# Patient Record
Sex: Male | Born: 1990
Health system: Southern US, Community
[De-identification: ages and names within clinical notes are randomized; demographics above are authoritative.]

---

## 2020-07-04 ENCOUNTER — Encounter (HOSPITAL_COMMUNITY): Payer: Self-pay | Admitting: Internal Medicine

## 2020-07-04 ENCOUNTER — Inpatient Hospital Stay (HOSPITAL_COMMUNITY): Payer: BC Managed Care – PPO

## 2020-07-04 ENCOUNTER — Inpatient Hospital Stay (HOSPITAL_COMMUNITY)
Admission: EM | Admit: 2020-07-04 | Discharge: 2020-07-12 | DRG: 871 | Disposition: A | Discharge status: Home / Self Care | Payer: BC Managed Care – PPO | Attending: Internal Medicine | Admitting: Internal Medicine

## 2020-07-04 DIAGNOSIS — Z20822 Contact with and (suspected) exposure to covid-19: Secondary | ICD-10-CM | POA: Diagnosis present

## 2020-07-04 DIAGNOSIS — R7989 Other specified abnormal findings of blood chemistry: Secondary | ICD-10-CM | POA: Diagnosis not present

## 2020-07-04 DIAGNOSIS — L539 Erythematous condition, unspecified: Secondary | ICD-10-CM

## 2020-07-04 DIAGNOSIS — M6282 Rhabdomyolysis: Secondary | ICD-10-CM | POA: Diagnosis present

## 2020-07-04 DIAGNOSIS — B9561 Methicillin susceptible Staphylococcus aureus infection as the cause of diseases classified elsewhere: Secondary | ICD-10-CM | POA: Diagnosis not present

## 2020-07-04 DIAGNOSIS — F1113 Opioid abuse with withdrawal: Secondary | ICD-10-CM | POA: Diagnosis present

## 2020-07-04 DIAGNOSIS — Z781 Physical restraint status: Secondary | ICD-10-CM | POA: Diagnosis not present

## 2020-07-04 DIAGNOSIS — R410 Disorientation, unspecified: Secondary | ICD-10-CM | POA: Diagnosis present

## 2020-07-04 DIAGNOSIS — G9341 Metabolic encephalopathy: Secondary | ICD-10-CM | POA: Diagnosis present

## 2020-07-04 DIAGNOSIS — B9689 Other specified bacterial agents as the cause of diseases classified elsewhere: Secondary | ICD-10-CM | POA: Diagnosis not present

## 2020-07-04 DIAGNOSIS — A4101 Sepsis due to Methicillin susceptible Staphylococcus aureus: Secondary | ICD-10-CM | POA: Diagnosis present

## 2020-07-04 DIAGNOSIS — G9340 Encephalopathy, unspecified: Secondary | ICD-10-CM | POA: Diagnosis not present

## 2020-07-04 DIAGNOSIS — F10231 Alcohol dependence with withdrawal delirium: Secondary | ICD-10-CM | POA: Diagnosis present

## 2020-07-04 DIAGNOSIS — N179 Acute kidney failure, unspecified: Secondary | ICD-10-CM

## 2020-07-04 DIAGNOSIS — E871 Hypo-osmolality and hyponatremia: Secondary | ICD-10-CM | POA: Diagnosis present

## 2020-07-04 DIAGNOSIS — D649 Anemia, unspecified: Secondary | ICD-10-CM | POA: Diagnosis present

## 2020-07-04 DIAGNOSIS — D696 Thrombocytopenia, unspecified: Secondary | ICD-10-CM | POA: Diagnosis present

## 2020-07-04 DIAGNOSIS — F17200 Nicotine dependence, unspecified, uncomplicated: Secondary | ICD-10-CM | POA: Diagnosis present

## 2020-07-04 DIAGNOSIS — N170 Acute kidney failure with tubular necrosis: Secondary | ICD-10-CM | POA: Diagnosis present

## 2020-07-04 DIAGNOSIS — B1920 Unspecified viral hepatitis C without hepatic coma: Secondary | ICD-10-CM | POA: Diagnosis present

## 2020-07-04 DIAGNOSIS — I82611 Acute embolism and thrombosis of superficial veins of right upper extremity: Secondary | ICD-10-CM | POA: Diagnosis present

## 2020-07-04 DIAGNOSIS — R7401 Elevation of levels of liver transaminase levels: Secondary | ICD-10-CM | POA: Diagnosis not present

## 2020-07-04 DIAGNOSIS — E875 Hyperkalemia: Secondary | ICD-10-CM | POA: Diagnosis present

## 2020-07-04 DIAGNOSIS — R768 Other specified abnormal immunological findings in serum: Secondary | ICD-10-CM

## 2020-07-04 DIAGNOSIS — M7989 Other specified soft tissue disorders: Secondary | ICD-10-CM | POA: Diagnosis not present

## 2020-07-04 DIAGNOSIS — R7402 Elevation of levels of lactic acid dehydrogenase (LDH): Secondary | ICD-10-CM | POA: Diagnosis present

## 2020-07-04 DIAGNOSIS — D72829 Elevated white blood cell count, unspecified: Secondary | ICD-10-CM

## 2020-07-04 DIAGNOSIS — F329 Major depressive disorder, single episode, unspecified: Secondary | ICD-10-CM | POA: Diagnosis present

## 2020-07-04 DIAGNOSIS — M25571 Pain in right ankle and joints of right foot: Secondary | ICD-10-CM | POA: Diagnosis present

## 2020-07-04 DIAGNOSIS — E874 Mixed disorder of acid-base balance: Secondary | ICD-10-CM | POA: Diagnosis present

## 2020-07-04 DIAGNOSIS — R7881 Bacteremia: Secondary | ICD-10-CM | POA: Diagnosis not present

## 2020-07-04 DIAGNOSIS — E861 Hypovolemia: Secondary | ICD-10-CM | POA: Diagnosis present

## 2020-07-04 DIAGNOSIS — F1410 Cocaine abuse, uncomplicated: Secondary | ICD-10-CM | POA: Diagnosis present

## 2020-07-04 DIAGNOSIS — F1910 Other psychoactive substance abuse, uncomplicated: Secondary | ICD-10-CM

## 2020-07-04 DIAGNOSIS — R7981 Abnormal blood-gas level: Secondary | ICD-10-CM | POA: Diagnosis not present

## 2020-07-04 LAB — BLOOD CULTURE ID PANEL (REFLEXED) - BCID2
A.calcoaceticus-baumannii: NOT DETECTED
Bacteroides fragilis: NOT DETECTED
Candida albicans: NOT DETECTED
Candida auris: NOT DETECTED
Candida glabrata: NOT DETECTED
Candida krusei: NOT DETECTED
Candida parapsilosis: NOT DETECTED
Candida tropicalis: NOT DETECTED
Cryptococcus neoformans/gattii: NOT DETECTED
Enterobacter cloacae complex: NOT DETECTED
Enterobacterales: NOT DETECTED
Enterococcus Faecium: NOT DETECTED
Enterococcus faecalis: NOT DETECTED
Escherichia coli: NOT DETECTED
Haemophilus influenzae: NOT DETECTED
Klebsiella aerogenes: NOT DETECTED
Klebsiella oxytoca: NOT DETECTED
Klebsiella pneumoniae: NOT DETECTED
Listeria monocytogenes: NOT DETECTED
Meth resistant mecA/C and MREJ: NOT DETECTED
Methicillin resistance mecA/C: NOT DETECTED
Neisseria meningitidis: NOT DETECTED
Proteus species: NOT DETECTED
Pseudomonas aeruginosa: NOT DETECTED
Salmonella species: NOT DETECTED
Serratia marcescens: NOT DETECTED
Staphylococcus aureus (BCID): DETECTED — AB
Staphylococcus epidermidis: DETECTED — AB
Staphylococcus lugdunensis: NOT DETECTED
Staphylococcus species: DETECTED — AB
Stenotrophomonas maltophilia: NOT DETECTED
Streptococcus agalactiae: NOT DETECTED
Streptococcus pneumoniae: NOT DETECTED
Streptococcus pyogenes: NOT DETECTED
Streptococcus species: DETECTED — AB

## 2020-07-04 LAB — COMPREHENSIVE METABOLIC PANEL
ALT: 359 U/L — ABNORMAL HIGH (ref 0–44)
AST: 802 U/L — ABNORMAL HIGH (ref 15–41)
Albumin: 4.8 g/dL (ref 3.5–5.0)
Alkaline Phosphatase: 82 U/L (ref 38–126)
Anion gap: 25 — ABNORMAL HIGH (ref 5–15)
BUN: 61 mg/dL — ABNORMAL HIGH (ref 6–20)
CO2: 20 mmol/L — ABNORMAL LOW (ref 22–32)
Calcium: 8 mg/dL — ABNORMAL LOW (ref 8.9–10.3)
Chloride: 86 mmol/L — ABNORMAL LOW (ref 98–111)
Creatinine, Ser: 3.49 mg/dL — ABNORMAL HIGH (ref 0.61–1.24)
GFR, Estimated: 23 mL/min — ABNORMAL LOW (ref >60)
Glucose, Bld: 70 mg/dL (ref 70–99)
Potassium: 6.2 mmol/L — ABNORMAL HIGH (ref 3.5–5.1)
Sodium: 131 mmol/L — ABNORMAL LOW (ref 135–145)
Total Bilirubin: 2.5 mg/dL — ABNORMAL HIGH (ref 0.3–1.2)
Total Protein: 7.7 g/dL (ref 6.5–8.1)

## 2020-07-04 LAB — URINALYSIS, ROUTINE W REFLEX MICROSCOPIC
Bilirubin Urine: NEGATIVE
Glucose, UA: NEGATIVE mg/dL
Ketones, ur: 5 mg/dL — AB
Leukocytes,Ua: NEGATIVE
Nitrite: NEGATIVE
Protein, ur: 100 mg/dL — AB
Specific Gravity, Urine: 1.015 (ref 1.005–1.030)
pH: 5 (ref 5.0–8.0)

## 2020-07-04 LAB — CBC WITH DIFFERENTIAL/PLATELET
Abs Immature Granulocytes: 0.14 10*3/uL — ABNORMAL HIGH (ref 0.00–0.07)
Abs Immature Granulocytes: 0.27 10*3/uL — ABNORMAL HIGH (ref 0.00–0.07)
Basophils Absolute: 0.1 10*3/uL (ref 0.0–0.1)
Basophils Absolute: 0 10*3/uL (ref 0.0–0.1)
Basophils Relative: 0 %
Basophils Relative: 0 %
Eosinophils Absolute: 0 10*3/uL (ref 0.0–0.5)
Eosinophils Absolute: 0 10*3/uL (ref 0.0–0.5)
Eosinophils Relative: 0 %
Eosinophils Relative: 0 %
HCT: 40.1 % (ref 39.0–52.0)
HCT: 40.7 % (ref 39.0–52.0)
Hemoglobin: 14.3 g/dL (ref 13.0–17.0)
Hemoglobin: 14.5 g/dL (ref 13.0–17.0)
Immature Granulocytes: 1 %
Immature Granulocytes: 1 %
Lymphocytes Relative: 3 %
Lymphocytes Relative: 3 %
Lymphs Abs: 0.7 10*3/uL (ref 0.7–4.0)
Lymphs Abs: 0.7 10*3/uL (ref 0.7–4.0)
MCH: 30.7 pg (ref 26.0–34.0)
MCH: 30.9 pg (ref 26.0–34.0)
MCHC: 35.7 g/dL (ref 30.0–36.0)
MCHC: 35.6 g/dL (ref 30.0–36.0)
MCV: 86.1 fL (ref 80.0–100.0)
MCV: 86.8 fL (ref 80.0–100.0)
Monocytes Absolute: 2 10*3/uL — ABNORMAL HIGH (ref 0.1–1.0)
Monocytes Absolute: 2.8 10*3/uL — ABNORMAL HIGH (ref 0.1–1.0)
Monocytes Relative: 8 %
Monocytes Relative: 11 %
Neutro Abs: 22.5 10*3/uL — ABNORMAL HIGH (ref 1.7–7.7)
Neutro Abs: 20.7 10*3/uL — ABNORMAL HIGH (ref 1.7–7.7)
Neutrophils Relative %: 88 %
Neutrophils Relative %: 85 %
Platelets: 203 10*3/uL (ref 150–400)
Platelets: 195 10*3/uL (ref 150–400)
RBC: 4.66 MIL/uL (ref 4.22–5.81)
RBC: 4.69 MIL/uL (ref 4.22–5.81)
RDW: 12.9 % (ref 11.5–15.5)
RDW: 13 % (ref 11.5–15.5)
WBC: 25.5 10*3/uL — ABNORMAL HIGH (ref 4.0–10.5)
WBC: 24.5 10*3/uL — ABNORMAL HIGH (ref 4.0–10.5)
nRBC: 0 % (ref 0.0–0.2)
nRBC: 0 % (ref 0.0–0.2)

## 2020-07-04 LAB — RAPID URINE DRUG SCREEN, HOSP PERFORMED
Amphetamines: NOT DETECTED
Barbiturates: NOT DETECTED
Benzodiazepines: NOT DETECTED
Cocaine: POSITIVE — AB
Opiates: POSITIVE — AB
Tetrahydrocannabinol: NOT DETECTED

## 2020-07-04 LAB — BASIC METABOLIC PANEL
Anion gap: 25 — ABNORMAL HIGH (ref 5–15)
Anion gap: 19 — ABNORMAL HIGH (ref 5–15)
BUN: 64 mg/dL — ABNORMAL HIGH (ref 6–20)
BUN: 73 mg/dL — ABNORMAL HIGH (ref 6–20)
CO2: 17 mmol/L — ABNORMAL LOW (ref 22–32)
CO2: 19 mmol/L — ABNORMAL LOW (ref 22–32)
Calcium: 8 mg/dL — ABNORMAL LOW (ref 8.9–10.3)
Calcium: 7.8 mg/dL — ABNORMAL LOW (ref 8.9–10.3)
Chloride: 88 mmol/L — ABNORMAL LOW (ref 98–111)
Chloride: 95 mmol/L — ABNORMAL LOW (ref 98–111)
Creatinine, Ser: 3.43 mg/dL — ABNORMAL HIGH (ref 0.61–1.24)
Creatinine, Ser: 3.37 mg/dL — ABNORMAL HIGH (ref 0.61–1.24)
GFR, Estimated: 24 mL/min — ABNORMAL LOW (ref >60)
GFR, Estimated: 24 mL/min — ABNORMAL LOW (ref >60)
Glucose, Bld: 69 mg/dL — ABNORMAL LOW (ref 70–99)
Glucose, Bld: 90 mg/dL (ref 70–99)
Potassium: 5.1 mmol/L (ref 3.5–5.1)
Potassium: 4.8 mmol/L (ref 3.5–5.1)
Sodium: 130 mmol/L — ABNORMAL LOW (ref 135–145)
Sodium: 133 mmol/L — ABNORMAL LOW (ref 135–145)

## 2020-07-04 LAB — CBG MONITORING, ED
Glucose-Capillary: 77 mg/dL (ref 70–99)
Glucose-Capillary: 76 mg/dL (ref 70–99)
Glucose-Capillary: 93 mg/dL (ref 70–99)
Glucose-Capillary: 83 mg/dL (ref 70–99)

## 2020-07-04 LAB — VOLATILES,BLD-ACETONE,ETHANOL,ISOPROP,METHANOL
Acetone, blood: <0.01 g/dL (ref 0.000–0.010)
Ethanol, blood: <0.01 g/dL (ref 0.000–0.010)
Isopropanol, blood: <0.01 g/dL (ref 0.000–0.010)
Methanol, blood: <0.01 g/dL (ref 0.000–0.010)

## 2020-07-04 LAB — LACTIC ACID, PLASMA: Lactic Acid, Venous: 1.8 mmol/L (ref 0.5–1.9)

## 2020-07-04 LAB — HEPATIC FUNCTION PANEL
ALT: 384 U/L — ABNORMAL HIGH (ref 0–44)
ALT: 443 U/L — ABNORMAL HIGH (ref 0–44)
AST: 928 U/L — ABNORMAL HIGH (ref 15–41)
AST: 1085 U/L — ABNORMAL HIGH (ref 15–41)
Albumin: 4.7 g/dL (ref 3.5–5.0)
Albumin: 3.7 g/dL (ref 3.5–5.0)
Alkaline Phosphatase: 83 U/L (ref 38–126)
Alkaline Phosphatase: 63 U/L (ref 38–126)
Bilirubin, Direct: 0.3 mg/dL — ABNORMAL HIGH (ref 0.0–0.2)
Bilirubin, Direct: 0.2 mg/dL (ref 0.0–0.2)
Indirect Bilirubin: 2.7 mg/dL — ABNORMAL HIGH (ref 0.3–0.9)
Indirect Bilirubin: 1.4 mg/dL — ABNORMAL HIGH (ref 0.3–0.9)
Total Bilirubin: 3 mg/dL — ABNORMAL HIGH (ref 0.3–1.2)
Total Bilirubin: 1.6 mg/dL — ABNORMAL HIGH (ref 0.3–1.2)
Total Protein: 7.8 g/dL (ref 6.5–8.1)
Total Protein: 6.3 g/dL — ABNORMAL LOW (ref 6.5–8.1)

## 2020-07-04 LAB — TROPONIN I (HIGH SENSITIVITY)
Troponin I (High Sensitivity): 226 ng/L (ref <18)
Troponin I (High Sensitivity): 170 ng/L (ref <18)

## 2020-07-04 LAB — PROTIME-INR
INR: 1.3 — ABNORMAL HIGH (ref 0.8–1.2)
Prothrombin Time: 15.3 seconds — ABNORMAL HIGH (ref 11.4–15.2)

## 2020-07-04 LAB — SALICYLATE LEVEL: Salicylate Lvl: <7 mg/dL — ABNORMAL LOW (ref 7.0–30.0)

## 2020-07-04 LAB — CK: Total CK: 28714 U/L — ABNORMAL HIGH (ref 49–397)

## 2020-07-04 LAB — HEPATITIS PANEL, ACUTE
HCV Ab: REACTIVE — AB
Hep A IgM: NONREACTIVE
Hep B C IgM: NONREACTIVE
Hepatitis B Surface Ag: NONREACTIVE

## 2020-07-04 LAB — ETHYLENE GLYCOL: Ethylene Glycol Lvl: <5 mg/dL

## 2020-07-04 LAB — HIV ANTIBODY (ROUTINE TESTING W REFLEX): HIV Screen 4th Generation wRfx: NONREACTIVE

## 2020-07-04 LAB — ACETAMINOPHEN LEVEL: Acetaminophen (Tylenol), Serum: <10 ug/mL — ABNORMAL LOW (ref 10–30)

## 2020-07-04 LAB — RESP PANEL BY RT-PCR (FLU A&B, COVID) ARPGX2
Influenza A by PCR: NEGATIVE
Influenza B by PCR: NEGATIVE
SARS Coronavirus 2 by RT PCR: NEGATIVE

## 2020-07-04 LAB — MAGNESIUM: Magnesium: 2.8 mg/dL — ABNORMAL HIGH (ref 1.7–2.4)

## 2020-07-04 LAB — PHOSPHORUS: Phosphorus: 6.9 mg/dL — ABNORMAL HIGH (ref 2.5–4.6)

## 2020-07-04 LAB — ETHANOL: Alcohol, Ethyl (B): <10 mg/dL (ref <10)

## 2020-07-04 MED ORDER — CALCIUM GLUCONATE 10 % IV SOLN
1.0000 g | Freq: Once | INTRAVENOUS | Status: AC
  Administered 2020-07-04: 1 g via INTRAVENOUS
  Filled 2020-07-04: qty 10

## 2020-07-04 MED ORDER — THIAMINE HCL 100 MG/ML IJ SOLN
Freq: Once | INTRAVENOUS | Status: AC
  Filled 2020-07-04: qty 1000

## 2020-07-04 MED ORDER — THIAMINE HCL 100 MG/ML IJ SOLN
100.0000 mg | Freq: Every day | INTRAMUSCULAR | Status: DC
  Administered 2020-07-05 – 2020-07-06 (×2): 100 mg via INTRAVENOUS
  Filled 2020-07-04 (×2): qty 2

## 2020-07-04 MED ORDER — SODIUM CHLORIDE 0.9 % IV SOLN: INTRAVENOUS | Status: DC

## 2020-07-04 MED ORDER — LORAZEPAM 2 MG/ML IJ SOLN
1.0000 mg | INTRAMUSCULAR | Status: DC | PRN
  Administered 2020-07-04: 1 mg via INTRAVENOUS
  Filled 2020-07-04: qty 1

## 2020-07-04 MED ORDER — THIAMINE HCL 100 MG PO TABS
100.0000 mg | ORAL_TABLET | Freq: Every day | ORAL | Status: DC
  Administered 2020-07-07 – 2020-07-12 (×6): 100 mg via ORAL
  Filled 2020-07-04 (×6): qty 1

## 2020-07-04 MED ORDER — LORAZEPAM 2 MG/ML IJ SOLN
0.0000 mg | Freq: Two times a day (BID) | INTRAMUSCULAR | Status: DC
  Administered 2020-07-06 – 2020-07-07 (×2): 2 mg via INTRAVENOUS
  Filled 2020-07-04 (×2): qty 1

## 2020-07-04 MED ORDER — DEXTROSE 50 % IV SOLN
1.0000 | Freq: Once | INTRAVENOUS | Status: AC
  Administered 2020-07-04: 50 mL via INTRAVENOUS
  Filled 2020-07-04: qty 50

## 2020-07-04 MED ORDER — LORAZEPAM 2 MG/ML IJ SOLN
0.0000 mg | Freq: Four times a day (QID) | INTRAMUSCULAR | Status: DC
  Administered 2020-07-04 – 2020-07-05 (×2): 4 mg via INTRAVENOUS
  Administered 2020-07-05 (×2): 2 mg via INTRAVENOUS
  Administered 2020-07-06: 4 mg via INTRAVENOUS
  Administered 2020-07-06: 3 mg via INTRAVENOUS
  Filled 2020-07-04: qty 1
  Filled 2020-07-04 (×4): qty 2
  Filled 2020-07-04: qty 1
  Filled 2020-07-04 (×2): qty 2

## 2020-07-04 MED ORDER — INSULIN ASPART 100 UNIT/ML ~~LOC~~ SOLN
5.0000 [IU] | Freq: Once | SUBCUTANEOUS | Status: AC
  Administered 2020-07-04: 5 [IU] via INTRAVENOUS

## 2020-07-04 MED ORDER — SODIUM POLYSTYRENE SULFONATE 15 GM/60ML PO SUSP
15.0000 g | Freq: Once | ORAL | Status: DC
  Administered 2020-07-04: 15 g via ORAL
  Filled 2020-07-04: qty 60

## 2020-07-04 MED ORDER — SODIUM CHLORIDE 0.9 % IV BOLUS
2000.0000 mL | Freq: Once | INTRAVENOUS | Status: AC
  Administered 2020-07-04: 2000 mL via INTRAVENOUS

## 2020-07-04 MED ORDER — ZIPRASIDONE MESYLATE 20 MG IM SOLR
20.0000 mg | Freq: Once | INTRAMUSCULAR | Status: AC
  Administered 2020-07-04: 20 mg via INTRAMUSCULAR
  Filled 2020-07-04: qty 20

## 2020-07-04 MED ORDER — CEFAZOLIN SODIUM-DEXTROSE 2-4 GM/100ML-% IV SOLN
2.0000 g | Freq: Two times a day (BID) | INTRAVENOUS | Status: DC
  Administered 2020-07-05 – 2020-07-07 (×6): 2 g via INTRAVENOUS
  Filled 2020-07-04 (×9): qty 100

## 2020-07-04 MED ORDER — FOLIC ACID 1 MG PO TABS
1.0000 mg | ORAL_TABLET | Freq: Every day | ORAL | Status: DC
  Administered 2020-07-07 – 2020-07-12 (×6): 1 mg via ORAL
  Filled 2020-07-04 (×7): qty 1

## 2020-07-04 MED ORDER — SODIUM CHLORIDE 0.9 % IV SOLN
INTRAVENOUS | Status: DC
  Administered 2020-07-04 – 2020-07-05 (×2): 1000 mL via INTRAVENOUS

## 2020-07-04 MED ORDER — LORAZEPAM 2 MG/ML IJ SOLN
1.0000 mg | INTRAMUSCULAR | Status: DC | PRN
  Administered 2020-07-04 (×3): 4 mg via INTRAVENOUS
  Administered 2020-07-05: 3 mg via INTRAVENOUS
  Administered 2020-07-05 (×2): 2 mg via INTRAVENOUS
  Administered 2020-07-05 (×3): 4 mg via INTRAVENOUS
  Administered 2020-07-05: 2 mg via INTRAVENOUS
  Administered 2020-07-06 (×2): 3 mg via INTRAVENOUS
  Administered 2020-07-06: 2 mg via INTRAVENOUS
  Administered 2020-07-06: 3 mg via INTRAVENOUS
  Administered 2020-07-07: 2 mg via INTRAVENOUS
  Filled 2020-07-04 (×9): qty 2
  Filled 2020-07-04: qty 1
  Filled 2020-07-04: qty 2
  Filled 2020-07-04 (×2): qty 1

## 2020-07-04 MED ORDER — SODIUM CHLORIDE 0.9 % IV BOLUS
1000.0000 mL | Freq: Once | INTRAVENOUS | Status: AC
  Administered 2020-07-04: 1000 mL via INTRAVENOUS

## 2020-07-04 MED ORDER — LORAZEPAM 2 MG/ML IJ SOLN
2.0000 mg | INTRAMUSCULAR | Status: DC | PRN
  Administered 2020-07-04: 2 mg via INTRAVENOUS
  Filled 2020-07-04: qty 1

## 2020-07-04 MED ORDER — SODIUM POLYSTYRENE SULFONATE 15 GM/60ML PO SUSP
15.0000 g | Freq: Once | ORAL | Status: AC
  Administered 2020-07-04: 15 g via RECTAL

## 2020-07-04 MED ORDER — ADULT MULTIVITAMIN W/MINERALS CH
1.0000 | ORAL_TABLET | Freq: Every day | ORAL | Status: DC
  Administered 2020-07-07 – 2020-07-12 (×6): 1 via ORAL
  Filled 2020-07-04 (×6): qty 1

## 2020-07-04 MED ORDER — LORAZEPAM 1 MG PO TABS
1.0000 mg | ORAL_TABLET | ORAL | Status: DC | PRN
Start: 2020-07-04 — End: 2020-07-07
  Filled 2020-07-04: qty 4

## 2020-07-04 MED ORDER — CEFAZOLIN SODIUM-DEXTROSE 2-4 GM/100ML-% IV SOLN
2.0000 g | Freq: Three times a day (TID) | INTRAVENOUS | Status: DC
  Filled 2020-07-04 (×2): qty 100

## 2020-07-04 NOTE — ED Notes (Signed)
Admission orders repeat labs that were drawn (including hepatic function panel,acetaminophen, bmp, ck,salicylate), clicked off because they were drawn already

## 2020-07-04 NOTE — Progress Notes (Signed)
[facility] PROGRESS NOTE    Progress Note  Zachary Hurst  IZT:[id number] DOB: 12/20/1990 DOA: 07/04/2020 PCP: No primary care provider on file.     Brief Narrative:   Zachary Hurst is an 30 y.o. male past medical history of polysubstance abuse who was found confused in a parking lot and brought in by EMS.  Twelve-lead EKG showed peaked T waves acute kidney injury and hyperkalemia creatinine on admission was 3.4 and a potassium of 6.2 (with a baseline creatinine of less than 1) significantly elevated LFTs and T bili, COVID-19 PCR was negative.  UDS was positive for cocaine and opiates.   Significant studies: 07/04/2020 CT renal showed enlarged right kidney. 07/04/2020 urinalysis 0-6 white blood cells urine dipstick showed large amount of blood and 0 red blood cell count. 07/04/2020 chest x-ray showed no acute abnormalities  Antibiotics:  Microbiology data: Blood culture:  Procedures: None  Assessment/Plan:   Acute metabolic encephalopathy: In the setting of polysubstance abuse as he was agitated he received Geodon in the ED. We will continue Ativan IV as needed.  Rhabdomyolysis: In the setting of cocaine abuse we will go ahead and give him 2 L of normal saline and continue strict I's and O's we will also continue aggressive IV fluid hydration recheck CK in the morning.  Acute kidney injury: Likely due to rhabdomyolysis UA showed large amounts of blood but 0 red blood cells. CT of the renal showed enlarged right renal parenchyma. He was started on aggressive IV fluid hydration we will go ahead and give him 2 L of normal saline and continue normal saline at 125. He is unchanged continue to monitor strict I's and O's and continue aggressive IV fluid hydration.  Hyperkalemia: Likely due to rhabdomyolysis He was also given IV calcium D50 and insulin. After IV fluids and D50 with insulin his potassium is 5.1 we will give him oral Kayexalate once he is awake if his  potassium remains greater than 5.  Elevated LFT's: Tylenol levels negative likely due to rhabdomyolysis. Alcohol level less than 10 UDS was positive for cocaine and opiates. Acute hepatitis panel is pending.  Mixed high anion gap metabolic acidosis plus metabolic alkalosis: Salicylate level were negative.  Likely due to acute kidney injury. His calculated serum osmolarity is 289, will go ahead and measure his serum osmolality. They have ordered measurements of methanol, ethylene glycol and Isopropyl alcohol.  The latter will be negative as they usually have a normal anion gap. Lactic acid is pending.  Leukocytosis: Likely reactive,, has remained afebrile culture data has remained negative till date repeat a CBC with differential tomorrow morning.    DVT prophylaxis: lovexno Family Communication:none Status is: Inpatient  Remains inpatient appropriate because:Hemodynamically unstable   Dispo: The patient is from: Home              Anticipated d/c is to: Home              Anticipated d/c date is: > 3 days              Patient currently is not medically stable to d/c.   Difficult to place patient No        Code Status:     Code Status Orders  (From admission, onward)         Start     Ordered   07/04/20 0225  Full code  Continuous        07/04/20 0226        Code  Status History    This patient has a current code status but no historical code status.   Advance Care Planning Activity        IV Access:    Peripheral IV   Procedures and diagnostic studies:   DG CHEST PORT 1 VIEW  Result Date: 07/04/2020 CLINICAL DATA:  Leukocytosis.  Heroin and cocaine use. EXAM: PORTABLE CHEST 1 VIEW COMPARISON:  CT renal 07/04/2020 FINDINGS: The heart size and mediastinal contours are within normal limits. No focal consolidation. No pulmonary edema. No pleural effusion. No pneumothorax. No acute osseous abnormality. IMPRESSION: No active disease. Electronically Signed    By: Tish Frederickson M.D.   On: 07/04/2020 06:48   CT RENAL STONE STUDY  Result Date: 07/04/2020 CLINICAL DATA:  Renal parenchymal disease with hematuria. EXAM: CT ABDOMEN AND PELVIS WITHOUT CONTRAST TECHNIQUE: Multidetector CT imaging of the abdomen and pelvis was performed following the standard protocol without IV contrast. COMPARISON:  None. FINDINGS: Lower chest: Right lower lobe subsegmental atelectasis. No acute abnormality. Hepatobiliary: No focal liver abnormality. No gallstones, gallbladder wall thickening, or pericholecystic fluid. No biliary dilatation. Pancreas: No focal lesion. Normal pancreatic contour. No surrounding inflammatory changes. No main pancreatic ductal dilatation. Spleen: Normal in size without focal abnormality. Adrenals/Urinary Tract: No adrenal nodule bilaterally. Slightly more full/enlgared right renal parenchyma compared to the left. No definite perinephric stranding. Limited evaluation for venous fluid collection on this noncontrast study. No nephrolithiasis, no hydronephrosis, and no contour-deforming renal mass. No ureterolithiasis or hydroureter. The urinary bladder is unremarkable. Stomach/Bowel: Stomach is within normal limits. No evidence of bowel wall thickening or dilatation. Appendix appears normal. Vascular/Lymphatic: No significant vascular findings are present. No enlarged abdominal or pelvic lymph nodes. Reproductive: Prostate is unremarkable. Other: No intraperitoneal free fluid. No intraperitoneal free gas. No organized fluid collection. Musculoskeletal: No acute or significant osseous findings. IMPRESSION: 1. Slightly more prominent/enlarged right renal parenchyma compared to the left-finding could possibly represent pyelonephritis. Limited evaluation on this noncontrast study. Consider renal ultrasound for further evaluation. 2. No nephroureterolithiasis bilaterally. Electronically Signed   By: Tish Frederickson M.D.   On: 07/04/2020 06:40     Medical  Consultants:    None.   Subjective:    Barbra Sarks confused moaning and groaning  Objective:    Vitals:   07/04/20 0045 07/04/20 0341 07/04/20 0400 07/04/20 0530  BP: 121/60  (!) 161/91 (!) 162/79  Pulse: (!) 115   (!) 106  Resp:   14 11  Temp:  98.5 F (36.9 C)    TempSrc:  Oral    SpO2: 100%   96%   SpO2: 96 %  No intake or output data in the 24 hours ending 07/04/20 0700 There were no vitals filed for this visit.  Exam: General exam: In no acute distress. Respiratory system: Good air movement and clear to auscultation. Cardiovascular system: S1 & S2 heard, RRR. No JVD. Gastrointestinal system: Abdomen is nondistended, soft and nontender.  Central nervous system: Totally confused.  Moving all 4 extremities without any difficulties. Extremities: No pedal edema. Skin: No rashes, lesions or ulcers  Data Reviewed:    Labs: Basic Metabolic Panel: Recent Labs  Lab 07/04/20 0113 07/04/20 0251  NA 131* 130*  K 6.2* 5.1  CL 86* 88*  CO2 20* 17*  GLUCOSE 70 69*  BUN 61* 64*  CREATININE 3.49* 3.43*  CALCIUM 8.0* 8.0*   GFR CrCl cannot be calculated (Unknown ideal weight.). Liver Function Tests: Recent Labs  Lab 07/04/20 0113 07/04/20 0251  AST 802* 928*  ALT 359* 384*  ALKPHOS 82 83  BILITOT 2.5* 3.0*  PROT 7.7 7.8  ALBUMIN 4.8 4.7   No results for input(s): LIPASE, AMYLASE in the last 168 hours. No results for input(s): AMMONIA in the last 168 hours. Coagulation profile No results for input(s): INR, PROTIME in the last 168 hours. COVID-19 Labs  No results for input(s): DDIMER, FERRITIN, LDH, CRP in the last 72 hours.  Lab Results  Component Value Date   SARSCOV2NAA NEGATIVE 07/04/2020    CBC: Recent Labs  Lab 07/04/20 0113 07/04/20 0251  WBC 25.5* 24.5*  NEUTROABS 22.5* 20.7*  HGB 14.3 14.5  HCT 40.1 40.7  MCV 86.1 86.8  PLT 203 195   Cardiac Enzymes: Recent Labs  Lab 07/04/20 0251  CKTOTAL 28,714*   BNP (last 3  results) No results for input(s): PROBNP in the last 8760 hours. CBG: Recent Labs  Lab 07/04/20 0255 07/04/20 0547  GLUCAP 77 76   D-Dimer: No results for input(s): DDIMER in the last 72 hours. Hgb A1c: No results for input(s): HGBA1C in the last 72 hours. Lipid Profile: No results for input(s): CHOL, HDL, LDLCALC, TRIG, CHOLHDL, LDLDIRECT in the last 72 hours. Thyroid function studies: No results for input(s): TSH, T4TOTAL, T3FREE, THYROIDAB in the last 72 hours.  Invalid input(s): FREET3 Anemia work up: No results for input(s): VITAMINB12, FOLATE, FERRITIN, TIBC, IRON, RETICCTPCT in the last 72 hours. Sepsis Labs: Recent Labs  Lab 07/04/20 0113 07/04/20 0251  WBC 25.5* 24.5*   Microbiology Recent Results (from the past 240 hour(s))  Resp Panel by RT-PCR (Flu A&B, Covid) Nasopharyngeal Swab     Status: None   Collection Time: 07/04/20  3:08 AM   Specimen: Nasopharyngeal Swab; Nasopharyngeal(NP) swabs in vial transport medium  Result Value Ref Range Status   SARS Coronavirus 2 by RT PCR NEGATIVE NEGATIVE Final    Comment: (NOTE) SARS-CoV-2 target nucleic acids are NOT DETECTED.  The SARS-CoV-2 RNA is generally detectable in upper respiratory specimens during the acute phase of infection. The lowest concentration of SARS-CoV-2 viral copies this assay can detect is 138 copies/mL. A negative result does not preclude SARS-Cov-2 infection and should not be used as the sole basis for treatment or other patient management decisions. A negative result may occur with  improper specimen collection/handling, submission of specimen other than nasopharyngeal swab, presence of viral mutation(s) within the areas targeted by this assay, and inadequate number of viral copies(<138 copies/mL). A negative result must be combined with clinical observations, patient history, and epidemiological information. The expected result is Negative.  Fact Sheet for Patients:   BloggerCourse.com  Fact Sheet for Healthcare Providers:  SeriousBroker.it  This test is no t yet approved or cleared by the [place] FDA and  has been authorized for detection and/or diagnosis of SARS-CoV-2 by FDA under an Emergency Use Authorization (EUA). This EUA will remain  in effect (meaning this test can be used) for the duration of the COVID-19 declaration under Section 564(b)(1) of the Act, 21 U.S.C.section 360bbb-3(b)(1), unless the authorization is terminated  or revoked sooner.       Influenza A by PCR NEGATIVE NEGATIVE Final   Influenza B by PCR NEGATIVE NEGATIVE Final    Comment: (NOTE) The Xpert Xpress SARS-CoV-2/FLU/RSV plus assay is intended as an aid in the diagnosis of influenza from Nasopharyngeal swab specimens and should not be used as a sole basis for treatment. Nasal washings and aspirates are unacceptable for Xpert Xpress SARS-CoV-2/FLU/RSV testing.  Fact Sheet for Patients: BloggerCourse.comhttps://www.fda.gov/media/152166/download  Fact Sheet for Healthcare Providers: SeriousBroker.ithttps://www.fda.gov/media/152162/download  This test is not yet approved or cleared by the [place] FDA and has been authorized for detection and/or diagnosis of SARS-CoV-2 by FDA under an Emergency Use Authorization (EUA). This EUA will remain in effect (meaning this test can be used) for the duration of the COVID-19 declaration under Section 564(b)(1) of the Act, 21 U.S.C. section 360bbb-3(b)(1), unless the authorization is terminated or revoked.  Performed at [facility], [address] [place], [place] [address]      Medications:    Continuous Infusions: . sodium chloride 150 mL/hr at 07/04/20 0422      LOS: 0 days   Marinda Elkbraham Feliz Ortiz  [facility]  07/04/2020, 7:00 AM

## 2020-07-04 NOTE — ED Triage Notes (Signed)
Pt arrived via ems due to snorting heroin, and injecting cocaine. Pt has been very aggressive, screaming and shouting while in room. Pt was found in a jeep barricading himself and kicking the windows of the vehicle.

## 2020-07-04 NOTE — ED Provider Notes (Signed)
[facility] EMERGENCY DEPARTMENT Provider Note   CSN: [id number] Arrival date & time: 07/04/20  0006     History Chief Complaint  Patient presents with  . Drug Problem    Zachary Hurst is a 30 y.o. male.  The history is provided by the patient and medical records.  Drug Problem   LEVEL V CAVEAT:  SUBSTANCE ABUSE  30 year old male presenting to the ED via EMS for erratic behavior.  Reportedly, patient is undergoing treatment at rehab facility, but was out for the weekend on a pass.  He was with his girlfriend they were using drugs, she reported to EMS that he snorted heroin and shot up cocaine.  Patient found by EMS barricaded in jeep and attempting to kick out the windows.  On arrival to ED, he is yelling and screaming.  No past medical history on file.  There are no problems to display for this patient.     No family history on file.     Home Medications Prior to Admission medications   Not on File    Allergies    Patient has no allergy information on record.  Review of Systems   Review of Systems  Unable to perform ROS: Other    Physical Exam Updated Vital Signs There were no vitals taken for this visit.  Physical Exam Vitals and nursing note reviewed.  Constitutional:      General: He is awake.     Appearance: He is well-developed and well-nourished.  HENT:     Head: Normocephalic and atraumatic.     Mouth/Throat:     Mouth: Oropharynx is clear and moist.  Eyes:     Extraocular Movements: EOM normal.     Conjunctiva/sclera: Conjunctivae normal.     Pupils: Pupils are equal, round, and reactive to light.  Cardiovascular:     Rate and Rhythm: Normal rate and regular rhythm.     Heart sounds: Normal heart sounds.  Pulmonary:     Effort: Pulmonary effort is normal.     Breath sounds: Normal breath sounds.  Abdominal:     General: Bowel sounds are normal.     Palpations: Abdomen is soft.  Musculoskeletal:        General:  Normal range of motion.     Cervical back: Normal range of motion.     Comments: Abrasions noted to bilateral feet, no active bleeding, no signs of infection  Skin:    General: Skin is warm and dry.  Neurological:     Mental Status: He is alert and oriented to person, place, and time.  Psychiatric:        Attention and Perception: He is inattentive.        Mood and Affect: Mood and affect normal.        Behavior: Behavior is agitated.     Comments: Yelling, screaming, very agitated, kicking at staff, refusing to sit still, uncooperative with staff     ED Results / Procedures / Treatments   Labs (all labs ordered are listed, but only abnormal results are displayed) Labs Reviewed  CBC WITH DIFFERENTIAL/PLATELET - Abnormal; Notable for the following components:      Result Value   WBC 25.5 (*)    Neutro Abs 22.5 (*)    Monocytes Absolute 2.0 (*)    Abs Immature Granulocytes 0.14 (*)    All other components within normal limits  COMPREHENSIVE METABOLIC PANEL - Abnormal; Notable for the following components:   Sodium 131 (*)  Potassium 6.2 (*)    Chloride 86 (*)    CO2 20 (*)    BUN 61 (*)    Creatinine, Ser 3.49 (*)    Calcium 8.0 (*)    AST 802 (*)    ALT 359 (*)    Total Bilirubin 2.5 (*)    GFR, Estimated 23 (*)    Anion gap 25 (*)    All other components within normal limits  SALICYLATE LEVEL - Abnormal; Notable for the following components:   Salicylate Lvl <7.0 (*)    All other components within normal limits  ACETAMINOPHEN LEVEL - Abnormal; Notable for the following components:   Acetaminophen (Tylenol), Serum <10 (*)    All other components within normal limits  RESP PANEL BY RT-PCR (FLU A&B, COVID) ARPGX2  ETHANOL  RAPID URINE DRUG SCREEN, HOSP PERFORMED  CK  HEPATITIS PANEL, ACUTE  URINALYSIS, ROUTINE W REFLEX MICROSCOPIC  HIV ANTIBODY (ROUTINE TESTING W REFLEX)  HEPATIC FUNCTION PANEL  CBC WITH DIFFERENTIAL/PLATELET  BASIC METABOLIC PANEL  URINALYSIS,  ROUTINE W REFLEX MICROSCOPIC  VOLATILES,BLD-ACETONE,ETHANOL,ISOPROP,METHANOL  ETHYLENE GLYCOL  CBG MONITORING, ED    EKG None  Radiology No results found.  Procedures Procedures   CRITICAL CARE Performed by: Garlon Hatchet   Total critical care time: 45 minutes  Critical care time was exclusive of separately billable procedures and treating other patients.  Critical care was necessary to treat or prevent imminent or life-threatening deterioration.  Critical care was time spent personally by me on the following activities: development of treatment plan with patient and/or surrogate as well as nursing, discussions with consultants, evaluation of patient's response to treatment, examination of patient, obtaining history from patient or surrogate, ordering and performing treatments and interventions, ordering and review of laboratory studies, ordering and review of radiographic studies, pulse oximetry and re-evaluation of patient's condition.   Medications Ordered in ED Medications  sodium chloride 0.9 % bolus 1,000 mL (has no administration in time range)  ziprasidone (GEODON) injection 20 mg (20 mg Intramuscular Given 07/04/20 0021)    ED Course  I have reviewed the triage vital signs and the nursing notes.  Pertinent labs & imaging results that were available during my care of the patient were reviewed by me and considered in my medical decision making (see chart for details).    MDM Rules/Calculators/A&P    30 year old male presenting to the ED with erratic behavior here after snorting heroin and injecting cocaine.  Per EMS currently at rehab facility but was out on a weekend pass when he used drugs with girlfriend.  On arrival to ED he is agitated, yelling, screaming, and uncooperative.  He is kicking at staff and refusing any kind of medical care.  He appears heavily under the influence.  He will be given Geodon.    1:07 AM Patient now sleeping soundly after geodon.   Work-up pending.  Will monitor.  2:10 AM Labs as above-- chemistry grossly abnormal with significant transaminitis but in 2:1 ratio consistent with EtOH.  Ethanol here is negative however and tylenol level WNL.  Has findings of ARF with SrCr 3.49.  Last on record in Dec 2021 which was 1.07 at [facility].  Will initiate IVF.  Will add on hepatitis panel, CK.  Will require admission for ongoing care.  2:26 AM Spoke with hospitalist, Dr. Toniann Fail-- recommended emergent treatment for hyperkalemia given t-wave changes on EKG.  Insulin, D50, and Ca+ gluconate ordered.  Requested to get in and out UA now along with  alcohol volatiles.  These orders have been placed.  He will admit for ongoing care.  Final Clinical Impression(s) / ED Diagnoses Final diagnoses:  Polysubstance abuse (HCC)  Acute renal failure, unspecified acute renal failure type [facility])  Transaminitis    Rx / DC Orders ED Discharge Orders    None       Garlon Hatchet, PA-C 07/04/20 0331    Nira Conn, MD 07/04/20 [phone]

## 2020-07-04 NOTE — ED Notes (Signed)
Pt has a new (third) IV which is secured with coban. At this time he is resting.

## 2020-07-04 NOTE — ED Notes (Signed)
Poison control recommended pt take acetylcysteine. They faxed over information regarding dosing. Called pharmacy in regards to placing this order. Was informed admitting doctor would have to put in the order.

## 2020-07-04 NOTE — ED Notes (Signed)
Obtained one set of cultures.

## 2020-07-04 NOTE — Progress Notes (Signed)
Patient seen on follow-up rounds while holding in ED for progressive bed. He is delirious, pulling at lines and tubes, being treated for EtOH withdrawal, and was placed in soft restraints for his safety. Discussed with RN.

## 2020-07-04 NOTE — ED Notes (Signed)
Pt removed his only remaining IV.

## 2020-07-04 NOTE — ED Notes (Signed)
Pt began waking up with acute delirium and hallucinations. Pt not redirectable to commands. Pt with rigid posture and unable to subdue. Patient placed back in restraints for safety. Pt also incontinent of urine. Cleaned, new gown, brief and foley applied to patient. Will page admitting MD. Also gave more ativan per CIWA protocol. Awaiting response.

## 2020-07-04 NOTE — ED Notes (Signed)
Pt continues to yell, flailing arms and legs, this RN and two NT are attempting to keep him safe.

## 2020-07-04 NOTE — Progress Notes (Signed)
PHARMACY - PHYSICIAN COMMUNICATION CRITICAL VALUE ALERT - BLOOD CULTURE IDENTIFICATION (BCID)  Zachary Hurst is an 30 y.o. male who presented to [facility] on 07/04/2020 with a chief complaint of heroin/cocaine overdose  Assessment:  Polymicrobial bacteremia, WBC elevated, acute renal failure  Name of physician (or Provider) Contacted: Dr.Opyd  Current antibiotics: None  Changes to prescribed antibiotics recommended:  Cefazolin 2g IV q12h, increase dose to q8h if Scr improves  Results for orders placed or performed during the hospital encounter of 07/04/20  Blood Culture ID Panel (Reflexed) (Collected: 07/04/2020  6:13 AM)  Result Value Ref Range   Enterococcus faecalis NOT DETECTED NOT DETECTED   Enterococcus Faecium NOT DETECTED NOT DETECTED   Listeria monocytogenes NOT DETECTED NOT DETECTED   Staphylococcus species DETECTED (A) NOT DETECTED   Staphylococcus aureus (BCID) DETECTED (A) NOT DETECTED   Staphylococcus epidermidis DETECTED (A) NOT DETECTED   Staphylococcus lugdunensis NOT DETECTED NOT DETECTED   Streptococcus species DETECTED (A) NOT DETECTED   Streptococcus agalactiae NOT DETECTED NOT DETECTED   Streptococcus pneumoniae NOT DETECTED NOT DETECTED   Streptococcus pyogenes NOT DETECTED NOT DETECTED   A.calcoaceticus-baumannii NOT DETECTED NOT DETECTED   Bacteroides fragilis NOT DETECTED NOT DETECTED   Enterobacterales NOT DETECTED NOT DETECTED   Enterobacter cloacae complex NOT DETECTED NOT DETECTED   Escherichia coli NOT DETECTED NOT DETECTED   Klebsiella aerogenes NOT DETECTED NOT DETECTED   Klebsiella oxytoca NOT DETECTED NOT DETECTED   Klebsiella pneumoniae NOT DETECTED NOT DETECTED   Proteus species NOT DETECTED NOT DETECTED   Salmonella species NOT DETECTED NOT DETECTED   Serratia marcescens NOT DETECTED NOT DETECTED   Haemophilus influenzae NOT DETECTED NOT DETECTED   Neisseria meningitidis NOT DETECTED NOT DETECTED   Pseudomonas aeruginosa NOT DETECTED  NOT DETECTED   Stenotrophomonas maltophilia NOT DETECTED NOT DETECTED   Candida albicans NOT DETECTED NOT DETECTED   Candida auris NOT DETECTED NOT DETECTED   Candida glabrata NOT DETECTED NOT DETECTED   Candida krusei NOT DETECTED NOT DETECTED   Candida parapsilosis NOT DETECTED NOT DETECTED   Candida tropicalis NOT DETECTED NOT DETECTED   Cryptococcus neoformans/gattii NOT DETECTED NOT DETECTED   Methicillin resistance mecA/C NOT DETECTED NOT DETECTED   Meth resistant mecA/C and MREJ NOT DETECTED NOT DETECTED    Abran Duke, PharmD, BCPS Clinical Pharmacist Phone: [phone]

## 2020-07-04 NOTE — ED Notes (Signed)
Pt pulled condom cath off, along with leads and pulse Ox. Pt got out of bed and once pt urinated, he laid back down in bed. Pt NOT aggressive toward staff and somewhat redirectable.

## 2020-07-04 NOTE — H&P (Signed)
History and Physical    Zachary Hurst OAC:[id number] DOB: 1990-11-22 DOA: 07/04/2020  PCP: No primary care provider on file.  Patient coming from: From rehab.  Chief Complaint: Confusion.  History obtained from ER physician and police officers.  Patient is confused.  HPI: Zachary Hurst is a 30 y.o. male with history of polysubstance abuse was in the rehab last few days was given a weekend pass.  Patient was found to be delirious and confused at parking lot in the rehab was found with his girlfriend.  Patient's girlfriend admits to patient taking heroin and cocaine.  Patient likely injected the heroin.  No other pillbox was seen at the site or any alcohol seen.  Patient became very confused delirious and was brought to the ER by EMS.  ED Course: In the ER patient had to be given Geodon for aggressive behavior.  EKG was showing sinus tachycardia with tall T waves.  Labs show acute renal failure with hyperkalemia with labs in December available in Care Everywhere showing normal creatinine.  Creatinine at our facility is 3.49 with potassium of 6.2 with markedly elevated LFTs of 8 and went to an ALT of 359 total bilirubin of 2.5.  WBC was 25.5 patient afebrile Covid test negative.  Patient was given fluid bolus admitted for acute delirium with elevated LFTs and acute renal failure.  Patient is here to give a UA.  Review of Systems: As per HPI, rest all negative.   History reviewed. No pertinent past medical history.  History reviewed. No pertinent surgical history.   reports that he has been smoking. He has never used smokeless tobacco. He reports current alcohol use. He reports current drug use. Drug: Heroin.  Not on File  Family History  Family history unknown: Yes    Prior to Admission medications   Not on File    Physical Exam: Constitutional: Moderately built and nourished. Vitals:   07/04/20 0045  BP: 121/60  Pulse: (!) 115  SpO2: 100%   Eyes: Anicteric no  pallor. ENMT: No discharge from the ears eyes nose or mouth. Neck: No mass felt.  No neck rigidity. Respiratory: No rhonchi or crepitations. Cardiovascular: S1-S2 heard. Abdomen: Soft nontender bowel sounds present. Musculoskeletal: No edema. Skin: No rash. Neurologic: Patient is presently sedated with Geodon.  Pupils are reactive light. Psychiatric: Patient is sedated.   Labs on Admission: I have personally reviewed following labs and imaging studies  CBC: Recent Labs  Lab 07/04/20 0113  WBC 25.5*  NEUTROABS 22.5*  HGB 14.3  HCT 40.1  MCV 86.1  PLT 203   Basic Metabolic Panel: Recent Labs  Lab 07/04/20 0113  NA 131*  K 6.2*  CL 86*  CO2 20*  GLUCOSE 70  BUN 61*  CREATININE 3.49*  CALCIUM 8.0*   GFR: CrCl cannot be calculated (Unknown ideal weight.). Liver Function Tests: Recent Labs  Lab 07/04/20 0113  AST 802*  ALT 359*  ALKPHOS 82  BILITOT 2.5*  PROT 7.7  ALBUMIN 4.8   No results for input(s): LIPASE, AMYLASE in the last 168 hours. No results for input(s): AMMONIA in the last 168 hours. Coagulation Profile: No results for input(s): INR, PROTIME in the last 168 hours. Cardiac Enzymes: No results for input(s): CKTOTAL, CKMB, CKMBINDEX, TROPONINI in the last 168 hours. BNP (last 3 results) No results for input(s): PROBNP in the last 8760 hours. HbA1C: No results for input(s): HGBA1C in the last 72 hours. CBG: No results for input(s): GLUCAP in the last 168  hours. Lipid Profile: No results for input(s): CHOL, HDL, LDLCALC, TRIG, CHOLHDL, LDLDIRECT in the last 72 hours. Thyroid Function Tests: No results for input(s): TSH, T4TOTAL, FREET4, T3FREE, THYROIDAB in the last 72 hours. Anemia Panel: No results for input(s): VITAMINB12, FOLATE, FERRITIN, TIBC, IRON, RETICCTPCT in the last 72 hours. Urine analysis: No results found for: COLORURINE, APPEARANCEUR, LABSPEC, PHURINE, GLUCOSEU, HGBUR, BILIRUBINUR, KETONESUR, PROTEINUR, UROBILINOGEN, NITRITE,  LEUKOCYTESUR Sepsis Labs: @LABRCNTIP (procalcitonin:4,lacticidven:4) )No results found for this or any previous visit (from the past 240 hour(s)).   Radiological Exams on Admission: No results found.  EKG: Independently reviewed.  Sinus tachycardia.  Tall T waves.  Assessment/Plan Principal Problem:   Acute encephalopathy Active Problems:   Acute delirium   Elevated LFTs   ARF (acute renal failure) (HCC)    1. Acute delirium/encephalopathy likely related to polysubstance.  Patient is presently sedated after getting Geodon.  We will continue to monitor closely in progressive care.  If patient again becomes agitated may need Ativan or may need Precedex. 2. Acute renal failure with hyperkalemia cause not clear.  UA is pending.  Will check CT renal study to make sure there is no obstruction.  Follow CK levels.  For hyperkalemia patient received IV calcium gluconate D50 IV insulin.  Follow metabolic panel. 3. Markedly elevated LFTs Tylenol levels were negative.  Discussed with poison control at this time advised no role for acetylcysteine since patient has not taken any Tylenol from the history.  We will need to get further history as per the poison control also consult GI.  Acute hepatitis panel has been sent.  Poison control also advised to repeat Tylenol levels.  Check INR.  Follow CT abdomen. 4. Metabolic acidosis high anion gap -I have ordered methanol, ethylene glycol and isopropyl alcohol levels along with lactic acid levels.  Salicylate levels were negative.  Will hydrate and follow metabolic panel.  Check UA. 5. Leukocytosis could be reactionary.  Will order blood cultures check chest x-ray.   Since patient has acute delirium with acute renal failure with hyperkalemia and markedly elevated LFTs will need close monitoring for any further worsening in inpatient status.  We need to discuss with patient's family for further history at this time unable to reach.   DVT prophylaxis:  SCDs. Code Status: Full code. Family Communication: Unable to reach family. Disposition Plan: To be determined. Consults called: None.  Discussed with poison control. Admission status: Inpatient.   MD [facility] Pager [phone].  If 7PM-7AM, please contact night-coverage [email or web address] Password [facility]  07/04/2020, 2:27 AM

## 2020-07-04 NOTE — ED Notes (Signed)
Provided lab information to [facility] at [employer].

## 2020-07-04 NOTE — ED Notes (Signed)
Pt pulled IV out. Informed Elizabeth - RN.

## 2020-07-04 NOTE — ED Notes (Signed)
Notified MD of new onset LLQ pain

## 2020-07-04 NOTE — ED Notes (Signed)
Spoke to pt's mother to update her on pt condition.

## 2020-07-04 NOTE — ED Notes (Signed)
Paged Dr David Stall

## 2020-07-04 NOTE — ED Notes (Signed)
Pt removed from restraints with off going RN. Pt lethargic, but able to answer questions.

## 2020-07-04 NOTE — ED Notes (Addendum)
Pt continuing to yell out multiple times, but pt can be redirected.

## 2020-07-05 ENCOUNTER — Inpatient Hospital Stay (HOSPITAL_COMMUNITY): Payer: BC Managed Care – PPO

## 2020-07-05 DIAGNOSIS — R7881 Bacteremia: Secondary | ICD-10-CM

## 2020-07-05 DIAGNOSIS — N179 Acute kidney failure, unspecified: Secondary | ICD-10-CM | POA: Diagnosis not present

## 2020-07-05 DIAGNOSIS — G9340 Encephalopathy, unspecified: Secondary | ICD-10-CM | POA: Diagnosis not present

## 2020-07-05 DIAGNOSIS — M6282 Rhabdomyolysis: Secondary | ICD-10-CM | POA: Diagnosis not present

## 2020-07-05 DIAGNOSIS — R768 Other specified abnormal immunological findings in serum: Secondary | ICD-10-CM

## 2020-07-05 DIAGNOSIS — B9689 Other specified bacterial agents as the cause of diseases classified elsewhere: Secondary | ICD-10-CM | POA: Diagnosis not present

## 2020-07-05 DIAGNOSIS — R7401 Elevation of levels of liver transaminase levels: Secondary | ICD-10-CM

## 2020-07-05 LAB — CBC WITH DIFFERENTIAL/PLATELET
Abs Immature Granulocytes: 0.1 10*3/uL — ABNORMAL HIGH (ref 0.00–0.07)
Basophils Absolute: 0 10*3/uL (ref 0.0–0.1)
Basophils Relative: 0 %
Eosinophils Absolute: 0 10*3/uL (ref 0.0–0.5)
Eosinophils Relative: 0 %
HCT: 34 % — ABNORMAL LOW (ref 39.0–52.0)
Hemoglobin: 11.8 g/dL — ABNORMAL LOW (ref 13.0–17.0)
Immature Granulocytes: 1 %
Lymphocytes Relative: 3 %
Lymphs Abs: 0.4 10*3/uL — ABNORMAL LOW (ref 0.7–4.0)
MCH: 31.2 pg (ref 26.0–34.0)
MCHC: 34.7 g/dL (ref 30.0–36.0)
MCV: 89.9 fL (ref 80.0–100.0)
Monocytes Absolute: 1.3 10*3/uL — ABNORMAL HIGH (ref 0.1–1.0)
Monocytes Relative: 9 %
Neutro Abs: 13.5 10*3/uL — ABNORMAL HIGH (ref 1.7–7.7)
Neutrophils Relative %: 87 %
Platelets: 122 10*3/uL — ABNORMAL LOW (ref 150–400)
RBC: 3.78 MIL/uL — ABNORMAL LOW (ref 4.22–5.81)
RDW: 13.2 % (ref 11.5–15.5)
WBC: 15.4 10*3/uL — ABNORMAL HIGH (ref 4.0–10.5)
nRBC: 0 % (ref 0.0–0.2)

## 2020-07-05 LAB — URINALYSIS, ROUTINE W REFLEX MICROSCOPIC
Bilirubin Urine: NEGATIVE
Glucose, UA: NEGATIVE mg/dL
Ketones, ur: 5 mg/dL — AB
Leukocytes,Ua: NEGATIVE
Nitrite: NEGATIVE
Protein, ur: 30 mg/dL — AB
RBC / HPF: >50 RBC/hpf — ABNORMAL HIGH (ref 0–5)
Specific Gravity, Urine: 1.01 (ref 1.005–1.030)
pH: 5 (ref 5.0–8.0)

## 2020-07-05 LAB — BASIC METABOLIC PANEL
Anion gap: 14 (ref 5–15)
Anion gap: 14 (ref 5–15)
Anion gap: 12 (ref 5–15)
BUN: 91 mg/dL — ABNORMAL HIGH (ref 6–20)
BUN: 89 mg/dL — ABNORMAL HIGH (ref 6–20)
BUN: 85 mg/dL — ABNORMAL HIGH (ref 6–20)
CO2: 18 mmol/L — ABNORMAL LOW (ref 22–32)
CO2: 17 mmol/L — ABNORMAL LOW (ref 22–32)
CO2: 20 mmol/L — ABNORMAL LOW (ref 22–32)
Calcium: 7.2 mg/dL — ABNORMAL LOW (ref 8.9–10.3)
Calcium: 7.3 mg/dL — ABNORMAL LOW (ref 8.9–10.3)
Calcium: 7.5 mg/dL — ABNORMAL LOW (ref 8.9–10.3)
Chloride: 101 mmol/L (ref 98–111)
Chloride: 105 mmol/L (ref 98–111)
Chloride: 108 mmol/L (ref 98–111)
Creatinine, Ser: 4.14 mg/dL — ABNORMAL HIGH (ref 0.61–1.24)
Creatinine, Ser: 4.15 mg/dL — ABNORMAL HIGH (ref 0.61–1.24)
Creatinine, Ser: 3.9 mg/dL — ABNORMAL HIGH (ref 0.61–1.24)
GFR, Estimated: 19 mL/min — ABNORMAL LOW (ref >60)
GFR, Estimated: 19 mL/min — ABNORMAL LOW (ref >60)
GFR, Estimated: 20 mL/min — ABNORMAL LOW (ref >60)
Glucose, Bld: 94 mg/dL (ref 70–99)
Glucose, Bld: 92 mg/dL (ref 70–99)
Glucose, Bld: 90 mg/dL (ref 70–99)
Potassium: 4.5 mmol/L (ref 3.5–5.1)
Potassium: 4.7 mmol/L (ref 3.5–5.1)
Potassium: 4.3 mmol/L (ref 3.5–5.1)
Sodium: 133 mmol/L — ABNORMAL LOW (ref 135–145)
Sodium: 136 mmol/L (ref 135–145)
Sodium: 140 mmol/L (ref 135–145)

## 2020-07-05 LAB — GLUCOSE, CAPILLARY
Glucose-Capillary: 74 mg/dL (ref 70–99)
Glucose-Capillary: 90 mg/dL (ref 70–99)
Glucose-Capillary: 92 mg/dL (ref 70–99)

## 2020-07-05 LAB — PROTIME-INR
INR: 1.3 — ABNORMAL HIGH (ref 0.8–1.2)
Prothrombin Time: 15.6 seconds — ABNORMAL HIGH (ref 11.4–15.2)

## 2020-07-05 LAB — HEPATIC FUNCTION PANEL
ALT: 525 U/L — ABNORMAL HIGH (ref 0–44)
AST: 1100 U/L — ABNORMAL HIGH (ref 15–41)
Albumin: 3.2 g/dL — ABNORMAL LOW (ref 3.5–5.0)
Alkaline Phosphatase: 69 U/L (ref 38–126)
Bilirubin, Direct: 0.2 mg/dL (ref 0.0–0.2)
Indirect Bilirubin: 1.1 mg/dL — ABNORMAL HIGH (ref 0.3–0.9)
Total Bilirubin: 1.3 mg/dL — ABNORMAL HIGH (ref 0.3–1.2)
Total Protein: 5.6 g/dL — ABNORMAL LOW (ref 6.5–8.1)

## 2020-07-05 LAB — ECHOCARDIOGRAM COMPLETE
Area-P 1/2: 3.37 cm2
S' Lateral: 2.6 cm

## 2020-07-05 LAB — CK: Total CK: 43297 U/L — ABNORMAL HIGH (ref 49–397)

## 2020-07-05 MED ORDER — SODIUM BICARBONATE 8.4 % IV SOLN
INTRAVENOUS | Status: DC
  Filled 2020-07-05: qty 100

## 2020-07-05 MED ORDER — THIAMINE HCL 100 MG/ML IJ SOLN
Freq: Once | INTRAVENOUS | Status: AC
  Filled 2020-07-05: qty 1000

## 2020-07-05 MED ORDER — SODIUM CHLORIDE 0.9 % IV BOLUS: 1000.0000 mL | Freq: Once | INTRAVENOUS | Status: DC

## 2020-07-05 MED ORDER — STERILE WATER FOR INJECTION IV SOLN
INTRAVENOUS | Status: DC
  Filled 2020-07-05: qty 850

## 2020-07-05 MED ORDER — SODIUM CHLORIDE 0.9 % IV SOLN: INTRAVENOUS | Status: AC

## 2020-07-05 MED ORDER — SODIUM BICARBONATE 8.4 % IV SOLN: INTRAVENOUS | Status: DC

## 2020-07-05 MED ORDER — SODIUM CHLORIDE 0.9 % IV SOLN: INTRAVENOUS | Status: DC

## 2020-07-05 MED ORDER — SODIUM CHLORIDE 0.9 % IV BOLUS
1000.0000 mL | Freq: Once | INTRAVENOUS | Status: AC
  Administered 2020-07-05: 1000 mL via INTRAVENOUS

## 2020-07-05 MED ORDER — LABETALOL HCL 5 MG/ML IV SOLN: 10.0000 mg | Freq: Four times a day (QID) | INTRAVENOUS | Status: DC | PRN

## 2020-07-05 MED ORDER — CHLORHEXIDINE GLUCONATE CLOTH 2 % EX PADS: 6.0000 | MEDICATED_PAD | Freq: Every day | CUTANEOUS | Status: DC

## 2020-07-05 MED ORDER — SODIUM BICARBONATE 8.4 % IV SOLN
INTRAVENOUS | Status: AC
  Filled 2020-07-05: qty 1000

## 2020-07-05 NOTE — Progress Notes (Signed)
Rapid RN notified and to give the 4mg  of Ativan.  07/05/20 1352  CIWA-Ar  Nausea and Vomiting 0  Tactile Disturbances 3  Tremor 0  Auditory Disturbances 2  Paroxysmal Sweats 0  Visual Disturbances 5  Anxiety 7  Headache, Fullness in Head 0  Agitation 6  Orientation and Clouding of Sensorium 2  CIWA-Ar Total 25

## 2020-07-05 NOTE — Consult Note (Addendum)
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 ( MSSA, streptococcus and Staph epidermidis) - no known hardware - Source is likely cutaneous given multiple abrasions vs endocarditis  Rhabdomyolysis/Transaminitis - IVF, acute hep panel negative except HCV ab reactive  Polysubstance abuse HCV ab reactive AKI - IVF, avoid nephrotoxins   Recommendations  Continue cefazolin based on CrCl given AKI Fu sensitivities from blood cultures on 2/14 Repeat 2 sets of blood cultures tomorrow TTE ( ordered) Fu CPK and LFTs Check HCV RNA, urine GC and RPR  Will reasess tomorrow for more detailed evaluation for metastatic infection as patient is very drowsy and uncooperative with exam today   Rest of the management as per the primary team. Please call with questions or concerns.  Thank you for the consult ________________________________________________________________________________________________________ HPI and Hospital Course: Zachary Hurst is a 30 y.o. male with history of polysubstance abuse who was brought to the ER by EMS on 2/14. Patient is a poor historian and hence, history is mostly obtained from chart review. Patient was in the rehab last few days and was out given a weekend pass.  Patient was found to be delirious and confused at parking lot in the rehab with his girlfriend. Per Girlfriend,  patient was taking heroin and cocaine.  Patient likely injected the heroin  Patient became very confused delirious and was brought to the ER by EMS.  At ED, patient had to be given Geodon for aggressive behavior. Afebrile WBC of  25.5 with bands. K 6.2 AST 802, ALT 359, TB 2.5, ALP 82, CK 28, 714 UA unremarkable  Blood cx 1/2 sets GPC in clusters. BCID as MSSA, Staph epidermidis and sterptococcus  ROS: cannot obtain today due to patient being very drowsy   Scheduled Meds: . folic acid  1 mg Oral Daily  . LORazepam  0-4 mg Intravenous Q6H   Followed by  . [START ON 07/06/2020] LORazepam  0-4 mg Intravenous Q12H  . multivitamin with minerals  1 tablet Oral Daily  . thiamine  100 mg Oral Daily   Or  . thiamine  100 mg Intravenous Daily   Continuous Infusions: .  ceFAZolin (ANCEF) IV Stopped (07/05/20 0035)  . sodium bicarbonate in D5W 1000 mL infusion    . sodium chloride     PRN Meds:.labetalol, LORazepam **OR** LORazepam, LORazepam  Social History   Socioeconomic History  . Marital status: Unknown    Spouse name: Not on file  . Number of children: Not on file  . Years of education: Not on file  . Highest education level: Not on file  Occupational History  .  Not on file  Tobacco Use  . Smoking status: Current Every Day Smoker  . Smokeless tobacco: Never Used  Substance and Sexual Activity  . Alcohol use: Yes  . Drug use: Yes    Types: Heroin  . Sexual activity: Not on file  Other Topics Concern  . Not on file  Social History Narrative  . Not on file   Social Determinants of Health   Financial Resource Strain: Not on file  Food Insecurity: Not on file  Transportation Needs: Not on file  Physical Activity: Not on file  Stress: Not on file  Social Connections: Not on file  Intimate Partner Violence: Not on file     Vitals BP 139/81 (BP Location: Right Arm)   Pulse (!) 107   Temp 98.3 F (36.8 C) (Oral)   Resp 20   SpO2 99%    Physical  Exam Constitutional:  very drowsy, responds to verbal stimuli though     Comments:   Cardiovascular:     Rate and Rhythm: Normal rate and regular rhythm.     Heart sounds: No murmur heard.   Pulmonary:     Effort: Pulmonary effort is normal.     Comments:   Abdominal:     Palpations: Abdomen is soft.     Tenderness: Non tender   Musculoskeletal:        General: No swelling or tenderness, Back was not able to be examined .   Skin:    Comments: abrasions in the lower extremities   Neurological:     General: unable to assess  Psychiatric:        Mood and Affect: unable to assess  Pertinent Microbiology Results for orders placed or performed during the hospital encounter of 07/04/20  Resp Panel by RT-PCR (Flu A&B, Covid) Nasopharyngeal Swab     Status: None   Collection Time: 07/04/20  3:08 AM   Specimen: Nasopharyngeal Swab; Nasopharyngeal(NP) swabs in vial transport medium  Result Value Ref Range Status   SARS Coronavirus 2 by RT PCR NEGATIVE NEGATIVE Final    Comment: (NOTE) SARS-CoV-2 target nucleic acids are NOT DETECTED.  The SARS-CoV-2 RNA is generally detectable in upper respiratory specimens during the acute phase of infection. The lowest concentration of SARS-CoV-2 viral copies this assay can detect is 138 copies/mL. A negative result does not preclude SARS-Cov-2 infection and should not be used as the sole basis for treatment or other patient management decisions. A negative result may occur with  improper specimen collection/handling, submission of specimen other than nasopharyngeal swab, presence of viral mutation(s) within the areas targeted by this assay, and inadequate number of viral copies(<138 copies/mL). A negative result must be combined with clinical observations, patient history, and epidemiological information. The expected result is Negative.  Fact Sheet for Patients:  BloggerCourse.com  Fact Sheet for Healthcare  Providers:  SeriousBroker.it  This test is no t yet approved or cleared by the [place] FDA and  has been authorized for detection and/or diagnosis of SARS-CoV-2 by FDA under an Emergency Use Authorization (EUA). This EUA will remain  in effect (meaning this test can be used) for the duration of the COVID-19 declaration under Section 564(b)(1) of the Act, 21 U.S.C.section 360bbb-3(b)(1), unless the authorization is terminated  or revoked sooner.       Influenza A by PCR NEGATIVE NEGATIVE Final   Influenza B by PCR NEGATIVE NEGATIVE Final    Comment: (NOTE) The Xpert Xpress SARS-CoV-2/FLU/RSV plus assay is intended as an  aid in the diagnosis of influenza from Nasopharyngeal swab specimens and should not be used as a sole basis for treatment. Nasal washings and aspirates are unacceptable for Xpert Xpress SARS-CoV-2/FLU/RSV testing.  Fact Sheet for Patients: BloggerCourse.comhttps://www.fda.gov/media/152166/download  Fact Sheet for Healthcare Providers: SeriousBroker.ithttps://www.fda.gov/media/152162/download  This test is not yet approved or cleared by the [place] FDA and has been authorized for detection and/or diagnosis of SARS-CoV-2 by FDA under an Emergency Use Authorization (EUA). This EUA will remain in effect (meaning this test can be used) for the duration of the COVID-19 declaration under Section 564(b)(1) of the Act, 21 U.S.C. section 360bbb-3(b)(1), unless the authorization is terminated or revoked.  Performed at [facility], [address] [place], [place] [address]   Culture, blood (routine x 2)     Status: None (Preliminary result)   Collection Time: 07/04/20  6:13 AM   Specimen: BLOOD  Result Value Ref Range Status   Specimen Description BLOOD RIGHT ANTECUBITAL  Final   Special Requests   Final    BOTTLES DRAWN AEROBIC AND ANAEROBIC Blood Culture adequate volume   Culture  Setup Time   Final    GRAM POSITIVE COCCI IN CLUSTERS IN BOTH AEROBIC  AND ANAEROBIC BOTTLES Organism ID to follow CRITICAL RESULT CALLED TO, READ BACK BY AND VERIFIED WITH: Melven SartoriusJ LEDFORD [facility] 07/04/20 2316 JDW Performed at [facility], [address] [place], [place] [address]    Culture PENDING  Incomplete   Report Status PENDING  Incomplete  Blood Culture ID Panel (Reflexed)     Status: Abnormal   Collection Time: 07/04/20  6:13 AM  Result Value Ref Range Status   Enterococcus faecalis NOT DETECTED NOT DETECTED Final   Enterococcus Faecium NOT DETECTED NOT DETECTED Final   Listeria monocytogenes NOT DETECTED NOT DETECTED Final   Staphylococcus species DETECTED (A) NOT DETECTED Final    Comment: CRITICAL RESULT CALLED TO, READ BACK BY AND VERIFIED WITH: J LEDFORD PHARMD 07/04/20 2316 JDW    Staphylococcus aureus (BCID) DETECTED (A) NOT DETECTED Final    Comment: Methicillin (oxacillin) susceptible Staphylococcus aureus (MSSA). Preferred therapy is anti staphylococcal beta lactam antibiotic (Cefazolin or Nafcillin), unless clinically contraindicated. CRITICAL RESULT CALLED TO, READ BACK BY AND VERIFIED WITH: J LEDFORD [facility] 07/04/20 2316 JDW    Staphylococcus epidermidis DETECTED (A) NOT DETECTED Final    Comment: CRITICAL RESULT CALLED TO, READ BACK BY AND VERIFIED WITH: J LEDFORD PHARMD 07/04/20 2316 JDW    Staphylococcus lugdunensis NOT DETECTED NOT DETECTED Final   Streptococcus species DETECTED (A) NOT DETECTED Final    Comment: Not Enterococcus species, Streptococcus agalactiae, Streptococcus pyogenes, or Streptococcus pneumoniae. CRITICAL RESULT CALLED TO, READ BACK BY AND VERIFIED WITH: J LEDFORD PHARMD 07/04/20 2316 JDW    Streptococcus agalactiae NOT DETECTED NOT DETECTED Final   Streptococcus pneumoniae NOT DETECTED NOT DETECTED Final   Streptococcus pyogenes NOT DETECTED NOT DETECTED Final   A.calcoaceticus-baumannii NOT DETECTED NOT DETECTED Final   Bacteroides fragilis NOT DETECTED NOT DETECTED Final   Enterobacterales NOT DETECTED  NOT DETECTED Final   Enterobacter cloacae complex NOT DETECTED NOT DETECTED Final   Escherichia coli NOT DETECTED NOT DETECTED Final   Klebsiella aerogenes NOT DETECTED NOT DETECTED Final   Klebsiella oxytoca NOT DETECTED NOT DETECTED Final   Klebsiella pneumoniae NOT DETECTED NOT DETECTED Final   Proteus species NOT DETECTED NOT DETECTED Final   Salmonella species NOT DETECTED NOT DETECTED Final   Serratia marcescens NOT DETECTED NOT DETECTED Final  Haemophilus influenzae NOT DETECTED NOT DETECTED Final   Neisseria meningitidis NOT DETECTED NOT DETECTED Final   Pseudomonas aeruginosa NOT DETECTED NOT DETECTED Final   Stenotrophomonas maltophilia NOT DETECTED NOT DETECTED Final   Candida albicans NOT DETECTED NOT DETECTED Final   Candida auris NOT DETECTED NOT DETECTED Final   Candida glabrata NOT DETECTED NOT DETECTED Final   Candida krusei NOT DETECTED NOT DETECTED Final   Candida parapsilosis NOT DETECTED NOT DETECTED Final   Candida tropicalis NOT DETECTED NOT DETECTED Final   Cryptococcus neoformans/gattii NOT DETECTED NOT DETECTED Final   Methicillin resistance mecA/C NOT DETECTED NOT DETECTED Final   Meth resistant mecA/C and MREJ NOT DETECTED NOT DETECTED Final    Comment: Performed at [facility], [address], [place], [place] [address]  Culture, blood (routine x 2)     Status: None (Preliminary result)   Collection Time: 07/04/20  7:55 AM   Specimen: BLOOD RIGHT HAND  Result Value Ref Range Status   Specimen Description BLOOD RIGHT HAND  Final   Special Requests   Final    BOTTLES DRAWN AEROBIC AND ANAEROBIC Blood Culture adequate volume   Culture   Final    NO GROWTH < 12 HOURS Performed at [facility], [address], [place], [place] [address]    Report Status PENDING  Incomplete   Pertinent Lab seen by me: CBC Latest Ref Rng & Units 07/05/2020 07/04/2020 07/04/2020  WBC 4.0 - 10.5 K/uL 15.4(H) 24.5(H) 25.5(H)  Hemoglobin 13.0 - 17.0 g/dL 11.8(L)  14.5 14.3  Hematocrit 39.0 - 52.0 % 34.0(L) 40.7 40.1  Platelets 150 - 400 K/uL 122(L) 195 203   CMP Latest Ref Rng & Units 07/05/2020 07/04/2020 07/04/2020  Glucose 70 - 99 mg/dL 94 - 90  BUN 6 - 20 mg/dL 38(V) - 56(E)  Creatinine 0.61 - 1.24 mg/dL 3.32(R) - 5.18(A)  Sodium 135 - 145 mmol/L 133(L) - 133(L)  Potassium 3.5 - 5.1 mmol/L 4.5 - 4.8  Chloride 98 - 111 mmol/L 101 - 95(L)  CO2 22 - 32 mmol/L 18(L) - 19(L)  Calcium 8.9 - 10.3 mg/dL 7.2(L) - 7.8(L)  Total Protein 6.5 - 8.1 g/dL 4.1(Y) 6.3(L) -  Total Bilirubin 0.3 - 1.2 mg/dL 6.0(Y) 3.0(Z) -  Alkaline Phos 38 - 126 U/L 69 63 -  AST 15 - 41 U/L 1,100(H) 1,085(H) -  ALT 0 - 44 U/L 525(H) 443(H) -     Pertinent Imagings/Other Imagings Plain films and CT images have been personally visualized and interpreted; radiology reports have been reviewed. Decision making incorporated into the Impression / Recommendations.  Rt foot xray 07/05/20 FINDINGS: There is no evidence of fracture or dislocation. There is no evidence of arthropathy or other focal bone abnormality. Os vesalinum is noted. Soft tissues are unremarkable. No radiopaque foreign bodies.  IMPRESSION: No acute fracture or malalignment. The soft tissues are within normal limits.  Chest Xray 07/04/2020 FINDINGS: The heart size and mediastinal contours are within normal limits.  No focal consolidation. No pulmonary edema. No pleural effusion. No pneumothorax.  No acute osseous abnormality.  IMPRESSION: No active disease.   CT renal study 07/04/20 IMPRESSION: 1. Slightly more prominent/enlarged right renal parenchyma compared to the left-finding could possibly represent pyelonephritis. Limited evaluation on this noncontrast study. Consider renal ultrasound for further evaluation. 2. No nephroureterolithiasis bilaterally. I have spent 60 minutes for this patient encounter including review of prior medical records with greater than 50% of time being face to  face  and coordination of their care.  Electronically signed by:   Rosiland Oz, MD Infectious Disease Physician [facility] Pager: [phone]

## 2020-07-05 NOTE — Progress Notes (Signed)
An employee from [employer] called to give information about pt. Pt is in an extended detox program. He completed the other program. She stated pt is a opioid and cocaine user. She stated he doesn't have any medical problems. On Sunday, pt went out and did heroin and cops came to get him. She also stated he is on the following meds:   Seroquel 50mg  PRN at night for sleep Motrin 600mg  daily Naltrexone 50mg  1 tab daily Wellbutrin Extended Release 150mg  daily

## 2020-07-05 NOTE — Progress Notes (Signed)
This nurse attempted to perform oral care and moisten his mouth and he bit the toothbrush and spit it out and refused oral care.

## 2020-07-05 NOTE — ED Notes (Signed)
Attempted to call nursing report to 3W 

## 2020-07-05 NOTE — Progress Notes (Signed)
  Echocardiogram 2D Echocardiogram has been performed.  Augustine Radar 07/05/2020, 4:51 PM

## 2020-07-05 NOTE — Progress Notes (Addendum)
[facility] PROGRESS NOTE    Progress Note  Zachary Hurst  YYT:[id number] DOB: 28-May-1990 DOA: 07/04/2020 PCP: Pcp, No     Brief Narrative:   Zachary Hurst is an 30 y.o. male past medical history of polysubstance abuse and IV  who was found confused in a parking lot and brought in by EMS.  Twelve-lead EKG showed peaked T waves acute kidney injury and hyperkalemia creatinine on admission was 3.4 and a potassium of 6.2 (with a baseline creatinine of less than 1) significantly elevated LFTs and T bili, COVID-19 PCR was negative.  UDS was positive for cocaine and opiates.   Significant studies: 07/04/2020 CT renal showed enlarged right kidney. 07/04/2020 urinalysis 0-6 white blood cells urine dipstick showed large amount of blood and 0 red blood cell count. 07/04/2020 chest x-ray showed no acute abnormalities  Antibiotics: 07/05/2020 IV cefazolin  Microbiology data: Blood culture:  + staph and strep  Procedures: None  Assessment/Plan:   Acute metabolic encephalopathy: In the setting of polysubstance abuse as he was agitated he received Geodon in the ED. More awake this morning but still confused.  Staph and strep bacteremia: Blood culture ID reflex panel was positive for staph and strep bacteremia, blood cultures are pending. He was started empirically on IV cefazolin.  MRSA not detected.  Rhabdomyolysis: In the setting of cocaine abuse and bacteremia, will go ahead and give him an additional liter of IV fluids continue IV fluids running after this, despite this his CK continues to rise as well as his creatinine.  Changes fluid to D5 with bicarbonate.  Acute kidney injury: Likely due to rhabdomyolysis UA showed large amounts of blood but 0 red blood cells. At this point in time there is no indication for dialysis, bicarbonate is 18 potassium is 4.5. We will give him an additional bolus of normal saline then continue D5 with bicarbonate. He is made about 2 L of urine  despite this his creatinine and his CK continues to rise.  Continue aggressive IV fluid hydration. Cont. strict I's and O's and daily weights.  Hyperkalemia: Likely due to rhabdomyolysis. Improved with oral Kayexalate and IV fluids. I am concerned that is of CKD continues to rise his potassium will continue to rise, in the setting of worsening renal function.  Elevated LFT's: Tylenol levels negative likely due to rhabdomyolysis. LFTs remain high. Alcohol level less than 10 UDS was positive for cocaine and opiates. Acute hepatitis panel is pending.  Mixed high anion gap metabolic acidosis plus metabolic alkalosis: Salicylate level were negative.  Likely due to acute kidney injury. His calculated serum osmolarity is 289, will go ahead and measure his serum osmolality. They have ordered measurements of methanol, ethylene glycol and Isopropyl alcohol.  The latter will be negative as they usually have a normal anion gap. Lactic acid is pending.  Hypovolemic hyponatremia: Cont Iv fluids. Recheck in am.  Leukocytosis: Likely reactive,, has remained afebrile culture data has remained negative till date repeat a CBC with differential tomorrow morning.    DVT prophylaxis: lovexnox Family Communication:none Status is: Inpatient  Remains inpatient appropriate because:Hemodynamically unstable   Dispo: The patient is from: Home              Anticipated d/c is to: Home              Anticipated d/c date is: > 3 days              Patient currently is not medically stable to d/c.  Difficult to place patient No        Code Status:     Code Status Orders  (From admission, onward)         Start     Ordered   07/04/20 0225  Full code  Continuous        07/04/20 0226        Code Status History    This patient has a current code status but no historical code status.   Advance Care Planning Activity        IV Access:    Peripheral IV   Procedures and diagnostic  studies:   DG CHEST PORT 1 VIEW  Result Date: 07/04/2020 CLINICAL DATA:  Leukocytosis.  Heroin and cocaine use. EXAM: PORTABLE CHEST 1 VIEW COMPARISON:  CT renal 07/04/2020 FINDINGS: The heart size and mediastinal contours are within normal limits. No focal consolidation. No pulmonary edema. No pleural effusion. No pneumothorax. No acute osseous abnormality. IMPRESSION: No active disease. Electronically Signed   By: Tish Frederickson M.D.   On: 07/04/2020 06:48   CT RENAL STONE STUDY  Result Date: 07/04/2020 CLINICAL DATA:  Renal parenchymal disease with hematuria. EXAM: CT ABDOMEN AND PELVIS WITHOUT CONTRAST TECHNIQUE: Multidetector CT imaging of the abdomen and pelvis was performed following the standard protocol without IV contrast. COMPARISON:  None. FINDINGS: Lower chest: Right lower lobe subsegmental atelectasis. No acute abnormality. Hepatobiliary: No focal liver abnormality. No gallstones, gallbladder wall thickening, or pericholecystic fluid. No biliary dilatation. Pancreas: No focal lesion. Normal pancreatic contour. No surrounding inflammatory changes. No main pancreatic ductal dilatation. Spleen: Normal in size without focal abnormality. Adrenals/Urinary Tract: No adrenal nodule bilaterally. Slightly more full/enlgared right renal parenchyma compared to the left. No definite perinephric stranding. Limited evaluation for venous fluid collection on this noncontrast study. No nephrolithiasis, no hydronephrosis, and no contour-deforming renal mass. No ureterolithiasis or hydroureter. The urinary bladder is unremarkable. Stomach/Bowel: Stomach is within normal limits. No evidence of bowel wall thickening or dilatation. Appendix appears normal. Vascular/Lymphatic: No significant vascular findings are present. No enlarged abdominal or pelvic lymph nodes. Reproductive: Prostate is unremarkable. Other: No intraperitoneal free fluid. No intraperitoneal free gas. No organized fluid collection.  Musculoskeletal: No acute or significant osseous findings. IMPRESSION: 1. Slightly more prominent/enlarged right renal parenchyma compared to the left-finding could possibly represent pyelonephritis. Limited evaluation on this noncontrast study. Consider renal ultrasound for further evaluation. 2. No nephroureterolithiasis bilaterally. Electronically Signed   By: Tish Frederickson M.D.   On: 07/04/2020 06:40     Medical Consultants:    None.   Subjective:    Zayan Delvecchio still confused he relates he is thirsty  Objective:    Vitals:   07/05/20 0500 07/05/20 0530 07/05/20 0539 07/05/20 0600  BP: (!) 144/94 (!) 181/91 (!) 181/91 (!) 144/97  Pulse: (!) 106 (!) 126 (!) 118 (!) 104  Resp: 14 (!) 34    Temp:      TempSrc:      SpO2: 99% 98%  100%   SpO2: 100 %   Intake/Output Summary (Last 24 hours) at 07/05/2020 8546 Last data filed at 07/05/2020 2703 Gross per 24 hour  Intake 4000 ml  Output 2375 ml  Net 1625 ml   There were no vitals filed for this visit.  Exam: General exam: In no acute distress. Respiratory system: Good air movement and clear to auscultation. Cardiovascular system: S1 & S2 heard, RRR. No JVD. Gastrointestinal system: Abdomen is nondistended, soft and nontender.  Extremities: No pedal  edema. Skin: Multiple ulcers in his right foot toe.  Data Reviewed:    Labs: Basic Metabolic Panel: Recent Labs  Lab 07/04/20 0113 07/04/20 0251 07/04/20 0755 07/04/20 1458 07/05/20 0412  NA 131* 130* 133*  --  133*  K 6.2* 5.1 4.8  --  4.5  CL 86* 88* 95*  --  101  CO2 20* 17* 19*  --  18*  GLUCOSE 70 69* 90  --  94  BUN 61* 64* 73*  --  91*  CREATININE 3.49* 3.43* 3.37*  --  4.14*  CALCIUM 8.0* 8.0* 7.8*  --  7.2*  MG  --   --   --  2.8*  --   PHOS  --   --   --  6.9*  --    GFR CrCl cannot be calculated (Unknown ideal weight.). Liver Function Tests: Recent Labs  Lab 07/04/20 0113 07/04/20 0251 07/04/20 1458 07/05/20 0412  AST 802* 928*  1,085* 1,100*  ALT 359* 384* 443* 525*  ALKPHOS 82 83 63 69  BILITOT 2.5* 3.0* 1.6* 1.3*  PROT 7.7 7.8 6.3* 5.6*  ALBUMIN 4.8 4.7 3.7 3.2*   No results for input(s): LIPASE, AMYLASE in the last 168 hours. No results for input(s): AMMONIA in the last 168 hours. Coagulation profile Recent Labs  Lab 07/04/20 0639 07/05/20 0412  INR 1.3* 1.3*   COVID-19 Labs  No results for input(s): DDIMER, FERRITIN, LDH, CRP in the last 72 hours.  Lab Results  Component Value Date   SARSCOV2NAA NEGATIVE 07/04/2020    CBC: Recent Labs  Lab 07/04/20 0113 07/04/20 0251 07/05/20 0412  WBC 25.5* 24.5* 15.4*  NEUTROABS 22.5* 20.7* 13.5*  HGB 14.3 14.5 11.8*  HCT 40.1 40.7 34.0*  MCV 86.1 86.8 89.9  PLT 203 195 122*   Cardiac Enzymes: Recent Labs  Lab 07/04/20 0251 07/05/20 0412  CKTOTAL 28,714* 43,297*   BNP (last 3 results) No results for input(s): PROBNP in the last 8760 hours. CBG: Recent Labs  Lab 07/04/20 0255 07/04/20 0547 07/04/20 1426 07/04/20 2053  GLUCAP 77 76 93 83   D-Dimer: No results for input(s): DDIMER in the last 72 hours. Hgb A1c: No results for input(s): HGBA1C in the last 72 hours. Lipid Profile: No results for input(s): CHOL, HDL, LDLCALC, TRIG, CHOLHDL, LDLDIRECT in the last 72 hours. Thyroid function studies: No results for input(s): TSH, T4TOTAL, T3FREE, THYROIDAB in the last 72 hours.  Invalid input(s): FREET3 Anemia work up: No results for input(s): VITAMINB12, FOLATE, FERRITIN, TIBC, IRON, RETICCTPCT in the last 72 hours. Sepsis Labs: Recent Labs  Lab 07/04/20 0113 07/04/20 0251 07/04/20 0638 07/05/20 0412  WBC 25.5* 24.5*  --  15.4*  LATICACIDVEN  --   --  1.8  --    Microbiology Recent Results (from the past 240 hour(s))  Resp Panel by RT-PCR (Flu A&B, Covid) Nasopharyngeal Swab     Status: None   Collection Time: 07/04/20  3:08 AM   Specimen: Nasopharyngeal Swab; Nasopharyngeal(NP) swabs in vial transport medium  Result Value Ref  Range Status   SARS Coronavirus 2 by RT PCR NEGATIVE NEGATIVE Final    Comment: (NOTE) SARS-CoV-2 target nucleic acids are NOT DETECTED.  The SARS-CoV-2 RNA is generally detectable in upper respiratory specimens during the acute phase of infection. The lowest concentration of SARS-CoV-2 viral copies this assay can detect is 138 copies/mL. A negative result does not preclude SARS-Cov-2 infection and should not be used as the sole basis for treatment or other  patient management decisions. A negative result may occur with  improper specimen collection/handling, submission of specimen other than nasopharyngeal swab, presence of viral mutation(s) within the areas targeted by this assay, and inadequate number of viral copies(<138 copies/mL). A negative result must be combined with clinical observations, patient history, and epidemiological information. The expected result is Negative.  Fact Sheet for Patients:  BloggerCourse.comhttps://www.fda.gov/media/152166/download  Fact Sheet for Healthcare Providers:  SeriousBroker.ithttps://www.fda.gov/media/152162/download  This test is no t yet approved or cleared by the [place] FDA and  has been authorized for detection and/or diagnosis of SARS-CoV-2 by FDA under an Emergency Use Authorization (EUA). This EUA will remain  in effect (meaning this test can be used) for the duration of the COVID-19 declaration under Section 564(b)(1) of the Act, 21 U.S.C.section 360bbb-3(b)(1), unless the authorization is terminated  or revoked sooner.       Influenza A by PCR NEGATIVE NEGATIVE Final   Influenza B by PCR NEGATIVE NEGATIVE Final    Comment: (NOTE) The Xpert Xpress SARS-CoV-2/FLU/RSV plus assay is intended as an aid in the diagnosis of influenza from Nasopharyngeal swab specimens and should not be used as a sole basis for treatment. Nasal washings and aspirates are unacceptable for Xpert Xpress SARS-CoV-2/FLU/RSV testing.  Fact Sheet for  Patients: BloggerCourse.comhttps://www.fda.gov/media/152166/download  Fact Sheet for Healthcare Providers: SeriousBroker.ithttps://www.fda.gov/media/152162/download  This test is not yet approved or cleared by the [place] FDA and has been authorized for detection and/or diagnosis of SARS-CoV-2 by FDA under an Emergency Use Authorization (EUA). This EUA will remain in effect (meaning this test can be used) for the duration of the COVID-19 declaration under Section 564(b)(1) of the Act, 21 U.S.C. section 360bbb-3(b)(1), unless the authorization is terminated or revoked.  Performed at [facility], [address] [place], [place] [address]   Culture, blood (routine x 2)     Status: None (Preliminary result)   Collection Time: 07/04/20  6:13 AM   Specimen: BLOOD  Result Value Ref Range Status   Specimen Description BLOOD RIGHT ANTECUBITAL  Final   Special Requests   Final    BOTTLES DRAWN AEROBIC AND ANAEROBIC Blood Culture adequate volume   Culture  Setup Time   Final    GRAM POSITIVE COCCI IN CLUSTERS IN BOTH AEROBIC AND ANAEROBIC BOTTLES Organism ID to follow CRITICAL RESULT CALLED TO, READ BACK BY AND VERIFIED WITH: Melven SartoriusJ LEDFORD [facility] 07/04/20 2316 JDW Performed at [facility], [address] [place], [place] [address]    Culture PENDING  Incomplete   Report Status PENDING  Incomplete  Blood Culture ID Panel (Reflexed)     Status: Abnormal   Collection Time: 07/04/20  6:13 AM  Result Value Ref Range Status   Enterococcus faecalis NOT DETECTED NOT DETECTED Final   Enterococcus Faecium NOT DETECTED NOT DETECTED Final   Listeria monocytogenes NOT DETECTED NOT DETECTED Final   Staphylococcus species DETECTED (A) NOT DETECTED Final    Comment: CRITICAL RESULT CALLED TO, READ BACK BY AND VERIFIED WITH: J LEDFORD PHARMD 07/04/20 2316 JDW    Staphylococcus aureus (BCID) DETECTED (A) NOT DETECTED Final    Comment: Methicillin (oxacillin) susceptible Staphylococcus aureus (MSSA). Preferred therapy  is anti staphylococcal beta lactam antibiotic (Cefazolin or Nafcillin), unless clinically contraindicated. CRITICAL RESULT CALLED TO, READ BACK BY AND VERIFIED WITH: J LEDFORD PHARMD 07/04/20 2316 JDW    Staphylococcus epidermidis DETECTED (A) NOT DETECTED Final    Comment: CRITICAL RESULT CALLED TO, READ BACK BY AND VERIFIED WITH: J LEDFORD  PHARMD 07/04/20 2316 JDW    Staphylococcus lugdunensis NOT DETECTED NOT DETECTED Final   Streptococcus species DETECTED (A) NOT DETECTED Final    Comment: Not Enterococcus species, Streptococcus agalactiae, Streptococcus pyogenes, or Streptococcus pneumoniae. CRITICAL RESULT CALLED TO, READ BACK BY AND VERIFIED WITH: J LEDFORD PHARMD 07/04/20 2316 JDW    Streptococcus agalactiae NOT DETECTED NOT DETECTED Final   Streptococcus pneumoniae NOT DETECTED NOT DETECTED Final   Streptococcus pyogenes NOT DETECTED NOT DETECTED Final   A.calcoaceticus-baumannii NOT DETECTED NOT DETECTED Final   Bacteroides fragilis NOT DETECTED NOT DETECTED Final   Enterobacterales NOT DETECTED NOT DETECTED Final   Enterobacter cloacae complex NOT DETECTED NOT DETECTED Final   Escherichia coli NOT DETECTED NOT DETECTED Final   Klebsiella aerogenes NOT DETECTED NOT DETECTED Final   Klebsiella oxytoca NOT DETECTED NOT DETECTED Final   Klebsiella pneumoniae NOT DETECTED NOT DETECTED Final   Proteus species NOT DETECTED NOT DETECTED Final   Salmonella species NOT DETECTED NOT DETECTED Final   Serratia marcescens NOT DETECTED NOT DETECTED Final   Haemophilus influenzae NOT DETECTED NOT DETECTED Final   Neisseria meningitidis NOT DETECTED NOT DETECTED Final   Pseudomonas aeruginosa NOT DETECTED NOT DETECTED Final   Stenotrophomonas maltophilia NOT DETECTED NOT DETECTED Final   Candida albicans NOT DETECTED NOT DETECTED Final   Candida auris NOT DETECTED NOT DETECTED Final   Candida glabrata NOT DETECTED NOT DETECTED Final   Candida krusei NOT DETECTED NOT DETECTED Final    Candida parapsilosis NOT DETECTED NOT DETECTED Final   Candida tropicalis NOT DETECTED NOT DETECTED Final   Cryptococcus neoformans/gattii NOT DETECTED NOT DETECTED Final   Methicillin resistance mecA/C NOT DETECTED NOT DETECTED Final   Meth resistant mecA/C and MREJ NOT DETECTED NOT DETECTED Final    Comment: Performed at [facility], [address], [place], [place] [address]  Culture, blood (routine x 2)     Status: None (Preliminary result)   Collection Time: 07/04/20  7:55 AM   Specimen: BLOOD RIGHT HAND  Result Value Ref Range Status   Specimen Description BLOOD RIGHT HAND  Final   Special Requests   Final    BOTTLES DRAWN AEROBIC AND ANAEROBIC Blood Culture adequate volume   Culture   Final    NO GROWTH < 12 HOURS Performed at [facility], [address], [place], [place] [address]    Report Status PENDING  Incomplete     Medications:   . folic acid  1 mg Oral Daily  . LORazepam  0-4 mg Intravenous Q6H   Followed by  . [START ON 07/06/2020] LORazepam  0-4 mg Intravenous Q12H  . multivitamin with minerals  1 tablet Oral Daily  . thiamine  100 mg Oral Daily   Or  . thiamine  100 mg Intravenous Daily   Continuous Infusions: .  ceFAZolin (ANCEF) IV Stopped (07/05/20 0035)  .  sodium bicarbonate (isotonic) infusion in sterile water 150 mL/hr at 07/05/20 0638      LOS: 1 day   Marinda Elk  [facility]  07/05/2020, 7:12 AM

## 2020-07-05 NOTE — ED Notes (Signed)
Pt transported to CT ?

## 2020-07-05 NOTE — Progress Notes (Signed)
Banging arms and legs and trying to get out of the bed and restraints.  Yelling, cussing, and threatening.  Wrist slightly red because of all the pulling but I am able to put 2 fingers under the restraints and he has good cap refill.

## 2020-07-05 NOTE — ED Notes (Signed)
hospitalist at bedside

## 2020-07-05 NOTE — Plan of Care (Signed)
  Problem: Safety: Goal: Non-violent Restraint(s) 07/05/2020 1436 by Thom Chimes, RN Outcome: Progressing 07/05/2020 1436 by Thom Chimes, RN Outcome: Progressing   Problem: Education: Goal: Knowledge of General Education information will improve Description: Including pain rating scale, medication(s)/side effects and non-pharmacologic comfort measures 07/05/2020 1436 by Thom Chimes, RN Outcome: Progressing 07/05/2020 1436 by Thom Chimes, RN Outcome: Progressing   Problem: Health Behavior/Discharge Planning: Goal: Ability to manage health-related needs will improve 07/05/2020 1436 by Thom Chimes, RN Outcome: Progressing 07/05/2020 1436 by Thom Chimes, RN Outcome: Progressing   Problem: Clinical Measurements: Goal: Ability to maintain clinical measurements within normal limits will improve 07/05/2020 1436 by Thom Chimes, RN Outcome: Progressing 07/05/2020 1436 by Thom Chimes, RN Outcome: Progressing Goal: Will remain free from infection 07/05/2020 1436 by Thom Chimes, RN Outcome: Progressing 07/05/2020 1436 by Thom Chimes, RN Outcome: Progressing Goal: Diagnostic test results will improve 07/05/2020 1436 by Thom Chimes, RN Outcome: Progressing 07/05/2020 1436 by Thom Chimes, RN Outcome: Progressing Goal: Respiratory complications will improve 07/05/2020 1436 by Thom Chimes, RN Outcome: Progressing 07/05/2020 1436 by Thom Chimes, RN Outcome: Progressing Goal: Cardiovascular complication will be avoided 07/05/2020 1436 by Thom Chimes, RN Outcome: Progressing 07/05/2020 1436 by Thom Chimes, RN Outcome: Progressing   Problem: Activity: Goal: Risk for activity intolerance will decrease 07/05/2020 1436 by Thom Chimes, RN Outcome: Progressing 07/05/2020 1436 by Thom Chimes, RN Outcome: Progressing   Problem: Nutrition: Goal: Adequate nutrition will be maintained 07/05/2020 1436 by Thom Chimes,  RN Outcome: Progressing 07/05/2020 1436 by Thom Chimes, RN Outcome: Progressing   Problem: Coping: Goal: Level of anxiety will decrease 07/05/2020 1436 by Thom Chimes, RN Outcome: Progressing 07/05/2020 1436 by Thom Chimes, RN Outcome: Progressing   Problem: Elimination: Goal: Will not experience complications related to bowel motility 07/05/2020 1436 by Thom Chimes, RN Outcome: Progressing 07/05/2020 1436 by Thom Chimes, RN Outcome: Progressing Goal: Will not experience complications related to urinary retention 07/05/2020 1436 by Thom Chimes, RN Outcome: Progressing 07/05/2020 1436 by Thom Chimes, RN Outcome: Progressing   Problem: Pain Managment: Goal: General experience of comfort will improve 07/05/2020 1436 by Thom Chimes, RN Outcome: Progressing 07/05/2020 1436 by Thom Chimes, RN Outcome: Progressing   Problem: Safety: Goal: Ability to remain free from injury will improve 07/05/2020 1436 by Thom Chimes, RN Outcome: Progressing 07/05/2020 1436 by Thom Chimes, RN Outcome: Progressing   Problem: Skin Integrity: Goal: Risk for impaired skin integrity will decrease 07/05/2020 1436 by Thom Chimes, RN Outcome: Progressing 07/05/2020 1436 by Thom Chimes, RN Outcome: Progressing

## 2020-07-05 NOTE — Progress Notes (Signed)
Received on cart accompanied by two RNs from ED.  Was combative, yelling, attempting to climb off of the stretcher.  Very combative and belligerent.  Restraints on per order.  Took 6 staff to get him onto the bed.  Nurse Morrie Sheldon gave bedside report and said that this is the way he has been for them and the MD is aware.  He is receiving ativan per [facility] protocol.

## 2020-07-05 NOTE — Plan of Care (Signed)

## 2020-07-05 NOTE — ED Notes (Signed)
Notified MD pt has received 12mg  ativan since 7pm last night. No improvement or changes in condition. Waiting response. Will continue to monitor.

## 2020-07-06 ENCOUNTER — Inpatient Hospital Stay (HOSPITAL_COMMUNITY): Payer: BC Managed Care – PPO

## 2020-07-06 DIAGNOSIS — G9340 Encephalopathy, unspecified: Secondary | ICD-10-CM

## 2020-07-06 DIAGNOSIS — G9341 Metabolic encephalopathy: Secondary | ICD-10-CM | POA: Diagnosis not present

## 2020-07-06 DIAGNOSIS — N179 Acute kidney failure, unspecified: Secondary | ICD-10-CM | POA: Diagnosis not present

## 2020-07-06 LAB — HCV RNA QUANT: HCV Quantitative: NOT DETECTED IU/mL (ref >50)

## 2020-07-06 LAB — OSMOLALITY: Osmolality: 318 mOsm/kg — ABNORMAL HIGH (ref 275–295)

## 2020-07-06 LAB — BASIC METABOLIC PANEL
Anion gap: 11 (ref 5–15)
BUN: 75 mg/dL — ABNORMAL HIGH (ref 6–20)
CO2: 20 mmol/L — ABNORMAL LOW (ref 22–32)
Calcium: 7.9 mg/dL — ABNORMAL LOW (ref 8.9–10.3)
Chloride: 112 mmol/L — ABNORMAL HIGH (ref 98–111)
Creatinine, Ser: 3.71 mg/dL — ABNORMAL HIGH (ref 0.61–1.24)
GFR, Estimated: 22 mL/min — ABNORMAL LOW (ref >60)
Glucose, Bld: 95 mg/dL (ref 70–99)
Potassium: 4 mmol/L (ref 3.5–5.1)
Sodium: 143 mmol/L (ref 135–145)

## 2020-07-06 LAB — RPR: RPR Ser Ql: NONREACTIVE

## 2020-07-06 LAB — CK: Total CK: 15385 U/L — ABNORMAL HIGH (ref 49–397)

## 2020-07-06 LAB — GLUCOSE, CAPILLARY
Glucose-Capillary: 101 mg/dL — ABNORMAL HIGH (ref 70–99)
Glucose-Capillary: 69 mg/dL — ABNORMAL LOW (ref 70–99)
Glucose-Capillary: 78 mg/dL (ref 70–99)
Glucose-Capillary: 83 mg/dL (ref 70–99)
Glucose-Capillary: 91 mg/dL (ref 70–99)

## 2020-07-06 MED ORDER — NICOTINE 21 MG/24HR TD PT24
21.0000 mg | MEDICATED_PATCH | Freq: Every day | TRANSDERMAL | Status: DC
  Administered 2020-07-06 – 2020-07-12 (×7): 21 mg via TRANSDERMAL
  Filled 2020-07-06 (×8): qty 1

## 2020-07-06 MED ORDER — HALOPERIDOL LACTATE 5 MG/ML IJ SOLN: 2.0000 mg | Freq: Four times a day (QID) | INTRAMUSCULAR | Status: DC | PRN

## 2020-07-06 MED ORDER — LIP MEDEX EX OINT
TOPICAL_OINTMENT | CUTANEOUS | Status: DC | PRN
  Filled 2020-07-06: qty 7

## 2020-07-06 NOTE — Progress Notes (Addendum)
I have seen and examined the patient. I have personally reviewed the clinical findings, laboratory findings, microbiological data and imaging studies. The assessment and treatment plan was discussed with the  Advance Practice Provider, Jeanine LuzGregory Calone  I agree with her/his recommendations except following additions/corrections.  4 point restrained , drowsy and somewhat alert than yesterday.  Afebrile.  TTE with no vegetations CPK and Cr improving  LFTs still deranged   Source is unclear at this time. He has some abrasions in his lower extremities.  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 Zachary Hurst is more cognizant and alert today although still confused at times. Follow up blood cultures drawn this morning and are pending. TTE without evidence of endocarditis and will need TEE when appropriate. There is no clear source of infection at this time. Will continue Cefazolin for polymicrobial bacteremia.  PLAN:  1. Continue Cefazolin 2. Monitor cultures for clearance of bacteremia 3. Will need TEE when able to rule out endocarditis.  4. Substance use withdrawal/management per primary team.   Principal Problem:   Acute encephalopathy Active Problems:   Acute delirium   Elevated LFTs   ARF (acute renal failure) (HCC)   Hepatitis C antibody positive in blood   . folic acid  1 mg Oral Daily  . LORazepam  0-4 mg Intravenous Q6H    Followed by  . LORazepam  0-4 mg Intravenous Q12H  . multivitamin with minerals  1 tablet Oral Daily  . thiamine  100 mg Oral Daily   Or  . thiamine  100 mg Intravenous Daily    SUBJECTIVE:  Afebrile overnight with no acute events. Continues to require restraints for safety. Aware the he is in the hospital and then [facility] No clear pain and does not recall any prior illness/sickness.   No Known Allergies   Review of Systems: Review of Systems  Constitutional: Negative for chills, fever and weight loss.  Respiratory: Negative for cough, shortness of breath and wheezing.   Cardiovascular: Negative for chest pain and leg swelling.  Gastrointestinal: Negative for abdominal pain, constipation, diarrhea, nausea and vomiting.  Skin: Negative for rash.      OBJECTIVE: Vitals:   07/06/20 0333 07/06/20 0356 07/06/20 0600 07/06/20 0729  BP: 129/78 124/82  123/80  Pulse: (!) 101 (!) 110  92  Resp: 18 18  18   Temp: 98.2 F (36.8 C) 98.6 F (37 C)  97.8 F (36.6 C)  TempSrc: Oral Oral  Axillary  SpO2: 98% 100%  100%  Weight:   96.3 kg    There is no height or weight on file to calculate BMI.  Physical Exam Constitutional:      General: He is not in acute distress.    Appearance: He is well-developed and well-nourished. He is ill-appearing.     Interventions: He is restrained.     Comments:  Seated in the bed; restless  Cardiovascular:     Rate and Rhythm: Normal rate and regular rhythm.     Pulses: Intact distal pulses.     Heart sounds: Normal heart sounds.  Pulmonary:     Effort: Pulmonary effort is normal.     Breath sounds: Normal breath sounds.  Skin:    General: Skin is warm and dry.  Neurological:     Mental Status: He is oriented to person, place, and time. He is lethargic.  Psychiatric:        Mood and Affect: Mood and affect normal.        Behavior: Behavior normal.        Thought Content: Thought content normal.        Judgment:  Judgment normal.     Lab Results Lab Results  Component Value Date   WBC 15.4 (H) 07/05/2020   HGB 11.8 (L) 07/05/2020   HCT 34.0 (L) 07/05/2020   MCV 89.9 07/05/2020   PLT 122 (L) 07/05/2020    Lab Results  Component Value Date   CREATININE 3.71 (H) 07/06/2020   BUN 75 (H) 07/06/2020   NA 143 07/06/2020   K 4.0 07/06/2020   CL 112 (H) 07/06/2020   CO2 20 (L) 07/06/2020    Lab Results  Component Value Date   ALT 525 (H) 07/05/2020   AST 1,100 (H) 07/05/2020   ALKPHOS 69 07/05/2020   BILITOT 1.3 (H) 07/05/2020     Microbiology: Recent Results (from the past 240 hour(s))  Resp Panel by RT-PCR (Flu A&B, Covid) Nasopharyngeal Swab     Status: None   Collection Time: 07/04/20  3:08 AM   Specimen: Nasopharyngeal Swab; Nasopharyngeal(NP) swabs in vial transport medium  Result Value Ref Range Status   SARS Coronavirus 2 by RT PCR NEGATIVE NEGATIVE Final    Comment: (NOTE) SARS-CoV-2 target nucleic acids are NOT DETECTED.  The SARS-CoV-2 RNA is generally detectable in upper respiratory specimens during the acute phase of infection. The lowest concentration of SARS-CoV-2 viral copies this assay can detect is 138 copies/mL. A negative result does not preclude SARS-Cov-2 infection and should not be used as the sole basis for treatment or other patient management decisions. A negative result may occur with  improper specimen collection/handling, submission of specimen other than nasopharyngeal swab, presence of viral mutation(s) within the areas targeted by this assay, and inadequate number of viral copies(<138 copies/mL). A negative result must be combined with clinical observations, patient history, and epidemiological information. The expected result is Negative.  Fact Sheet for Patients:  BloggerCourse.com  Fact Sheet for Healthcare Providers:  SeriousBroker.it  This test is no t yet approved or cleared by the [place] FDA and  has been authorized for detection and/or diagnosis of SARS-CoV-2 by FDA under an Emergency Use Authorization (EUA). This EUA will remain  in effect (meaning this test can be used) for the duration of the COVID-19 declaration under Section 564(b)(1) of the Act, 21 U.S.C.section 360bbb-3(b)(1), unless the authorization is terminated  or revoked sooner.       Influenza A by PCR NEGATIVE NEGATIVE Final   Influenza B by PCR NEGATIVE NEGATIVE Final    Comment: (NOTE) The Xpert Xpress SARS-CoV-2/FLU/RSV plus assay is intended as an aid in the diagnosis of influenza from Nasopharyngeal swab specimens and should not be used as a sole basis for treatment. Nasal washings and aspirates are unacceptable for Xpert Xpress SARS-CoV-2/FLU/RSV testing.  Fact Sheet for Patients: BloggerCourse.com  Fact Sheet for Healthcare Providers: SeriousBroker.it  This test is not yet approved or cleared by the [place] FDA and has been authorized for detection and/or diagnosis of SARS-CoV-2 by FDA under an Emergency Use Authorization (EUA). This EUA will remain in effect (meaning this test can be used) for the duration of the COVID-19 declaration under Section 564(b)(1) of the Act, 21 U.S.C. section 360bbb-3(b)(1), unless the authorization is terminated or revoked.  Performed at [facility], [address], [place], [place] [address]   Culture, blood (routine x 2)     Status: Abnormal (Preliminary result)   Collection Time: 07/04/20  6:13 AM   Specimen: BLOOD  Result Value Ref Range Status   Specimen Description BLOOD RIGHT ANTECUBITAL  Final   Special Requests   Final    BOTTLES DRAWN AEROBIC AND ANAEROBIC Blood Culture adequate volume   Culture  Setup Time   Final    GRAM POSITIVE COCCI IN CLUSTERS IN BOTH AEROBIC AND ANAEROBIC BOTTLES CRITICAL RESULT CALLED TO, READ BACK BY AND VERIFIED WITH: J LEDFORD PHARMD 07/04/20 2316  JDW    Culture (A)  Final    STAPHYLOCOCCUS AUREUS CULTURE REINCUBATED FOR BETTER GROWTH SUSCEPTIBILITIES TO FOLLOW    Report Status PENDING  Incomplete   Organism ID, Bacteria STAPHYLOCOCCUS AUREUS  Final      Susceptibility   Staphylococcus aureus - MIC*    CIPROFLOXACIN <=0.5 SENSITIVE Sensitive     ERYTHROMYCIN >=8 RESISTANT Resistant     GENTAMICIN <=0.5 SENSITIVE Sensitive     OXACILLIN <=0.25 SENSITIVE Sensitive     TETRACYCLINE <=1 SENSITIVE Sensitive     VANCOMYCIN <=0.5 SENSITIVE Sensitive     TRIMETH/SULFA <=10 SENSITIVE Sensitive     CLINDAMYCIN RESISTANT Resistant     RIFAMPIN <=0.5 SENSITIVE Sensitive     Inducible Clindamycin Value in next row Resistant      POSITIVEPerformed at [facility], [address], [place], [place] [address]    * STAPHYLOCOCCUS AUREUS  Blood Culture ID Panel (Reflexed)     Status: Abnormal   Collection Time: 07/04/20  6:13 AM  Result Value Ref Range Status   Enterococcus faecalis NOT DETECTED NOT DETECTED Final   Enterococcus Faecium NOT DETECTED NOT DETECTED Final   Listeria monocytogenes NOT DETECTED NOT DETECTED Final   Staphylococcus species DETECTED (A) NOT DETECTED Final    Comment: CRITICAL RESULT CALLED TO, READ BACK BY AND VERIFIED WITH: J LEDFORD PHARMD 07/04/20 2316 JDW    Staphylococcus aureus (BCID) DETECTED (A) NOT DETECTED Final    Comment: Methicillin (oxacillin) susceptible Staphylococcus aureus (MSSA). Preferred therapy is anti staphylococcal beta lactam antibiotic (Cefazolin or Nafcillin), unless clinically contraindicated. CRITICAL RESULT CALLED TO, READ BACK BY AND VERIFIED WITH: J LEDFORD [facility] 07/04/20 2316 JDW    Staphylococcus epidermidis DETECTED (A) NOT DETECTED Final    Comment: CRITICAL RESULT CALLED TO, READ BACK BY AND VERIFIED WITH: J LEDFORD PHARMD 07/04/20 2316 JDW    Staphylococcus lugdunensis NOT DETECTED NOT DETECTED Final   Streptococcus species DETECTED (A) NOT DETECTED Final    Comment:  Not Enterococcus species, Streptococcus agalactiae, Streptococcus pyogenes, or Streptococcus pneumoniae. CRITICAL RESULT CALLED TO, READ BACK BY AND VERIFIED WITH: J LEDFORD PHARMD 07/04/20 2316 JDW    Streptococcus agalactiae NOT DETECTED NOT DETECTED Final   Streptococcus pneumoniae NOT DETECTED NOT DETECTED Final   Streptococcus pyogenes NOT DETECTED NOT DETECTED Final   A.calcoaceticus-baumannii NOT DETECTED NOT DETECTED Final   Bacteroides fragilis NOT DETECTED NOT DETECTED  Final   Enterobacterales NOT DETECTED NOT DETECTED Final   Enterobacter cloacae complex NOT DETECTED NOT DETECTED Final   Escherichia coli NOT DETECTED NOT DETECTED Final   Klebsiella aerogenes NOT DETECTED NOT DETECTED Final   Klebsiella oxytoca NOT DETECTED NOT DETECTED Final   Klebsiella pneumoniae NOT DETECTED NOT DETECTED Final   Proteus species NOT DETECTED NOT DETECTED Final   Salmonella species NOT DETECTED NOT DETECTED Final   Serratia marcescens NOT DETECTED NOT DETECTED Final   Haemophilus influenzae NOT DETECTED NOT DETECTED Final   Neisseria meningitidis NOT DETECTED NOT DETECTED Final   Pseudomonas aeruginosa NOT DETECTED NOT DETECTED Final   Stenotrophomonas maltophilia NOT DETECTED NOT DETECTED Final   Candida albicans NOT DETECTED NOT DETECTED Final   Candida auris NOT DETECTED NOT DETECTED Final   Candida glabrata NOT DETECTED NOT DETECTED Final   Candida krusei NOT DETECTED NOT DETECTED Final   Candida parapsilosis NOT DETECTED NOT DETECTED Final   Candida tropicalis NOT DETECTED NOT DETECTED Final   Cryptococcus neoformans/gattii NOT DETECTED NOT DETECTED Final   Methicillin resistance mecA/C NOT DETECTED NOT DETECTED Final   Meth resistant mecA/C and MREJ NOT DETECTED NOT DETECTED Final    Comment: Performed at [facility], [address] [place], [place] [address]  Culture, blood (routine x 2)     Status: None (Preliminary result)   Collection Time: 07/04/20  7:55 AM   Specimen:  BLOOD RIGHT HAND  Result Value Ref Range Status   Specimen Description BLOOD RIGHT HAND  Final   Special Requests   Final    BOTTLES DRAWN AEROBIC AND ANAEROBIC Blood Culture adequate volume   Culture   Final    NO GROWTH 2 DAYS Performed at [facility], [address], [place], [place] [address]    Report Status PENDING  Incomplete     Marcos Eke, NP [facility]  07/06/2020  10:15 AM

## 2020-07-06 NOTE — Plan of Care (Signed)
  Problem: Safety: Goal: Non-violent Restraint(s) Outcome: Progressing   Problem: Education: Goal: Knowledge of General Education information will improve Description: Including pain rating scale, medication(s)/side effects and non-pharmacologic comfort measures Outcome: Progressing   Problem: Health Behavior/Discharge Planning: Goal: Ability to manage health-related needs will improve Outcome: Progressing   Problem: Clinical Measurements: Goal: Ability to maintain clinical measurements within normal limits will improve Outcome: Progressing Goal: Will remain free from infection Outcome: Progressing Goal: Diagnostic test results will improve Outcome: Progressing Goal: Respiratory complications will improve Outcome: Progressing Goal: Cardiovascular complication will be avoided Outcome: Progressing   Problem: Activity: Goal: Risk for activity intolerance will decrease Outcome: Progressing   Problem: Nutrition: Goal: Adequate nutrition will be maintained Outcome: Progressing   Problem: Coping: Goal: Level of anxiety will decrease Outcome: Progressing   

## 2020-07-06 NOTE — Consult Note (Signed)
[facility]  HISTORY AND PHYSICAL  Zachary Hurst is an 30 y.o. male.    Chief Complaint: erratic behavior  HPI: Pt is a 30 y/o M with a PMH sig for polysubstance abuse (EtOH, heroin, cocaine), MDD, h/o hep C who presented to [facility] ED with agitated encephalopathy in the setting of recent IVDU.  He was found to have AKI, rhabdomyolysis, and bacteremia with S aureus and Streptococcus.  He is on Ancef and CIWA protocol.  CK peaked at > 43000 on 2/15 and is now downtrending.  Cr which was previously 1.03 peaked at 4.14 2/15 and is 3.71 today 2/16.  He is on isotonic IVFs at 125/ hr.  UOP according to bedside RN was about 2L today.  In this setting we are asked to see.    Pt reports dry mouth and feels very thirsty.  Notes that his arms and legs are swollen.  Is frustrated that the Ativan makes him "talk slowly".    PMH/ PSH: IVDU H/o hep C EtOH abuse MDD  Medications:   Scheduled: . folic acid  1 mg Oral Daily  . LORazepam  0-4 mg Intravenous Q12H  . multivitamin with minerals  1 tablet Oral Daily  . nicotine  21 mg Transdermal Daily  . thiamine  100 mg Oral Daily   Or  . thiamine  100 mg Intravenous Daily    Medications Prior to Admission  Medication Sig Dispense Refill  . ibuprofen (ADVIL) 600 MG tablet Take 600 mg by mouth every 6 (six) hours as needed for cramping or headache.    . naltrexone (DEPADE) 50 MG tablet Take 50 mg by mouth daily.    . QUEtiapine (SEROQUEL) 50 MG tablet Take 50 mg by mouth at bedtime as needed (for sleep).    . venlafaxine XR (EFFEXOR-XR) 150 MG 24 hr capsule Take 150 mg by mouth daily with breakfast.      ALLERGIES:  No Known Allergies  FAM HX: Family History  Family history unknown: Yes    Social History:   reports that he has been smoking. He has never used smokeless tobacco. He reports current alcohol use. He reports current drug use. Drug: Heroin.  ROS: ROS: all other systems reviewed and are negative except as per  HPI  Blood pressure (P) 136/85, pulse (P) 99, temperature (P) 97.9 F (36.6 C), temperature source (P) Axillary, resp. rate (P) 18, weight 96.3 kg, SpO2 (P) 100 %. PHYSICAL EXAM: Physical Exam  GEN: lying in bed, in restraints, groggy HEENT sclerae anicteric and has dry, cracked lips NECK no overt JVD PULM clear bilaterally CV RRR II/VI murmur LLSB and LUSB ABD soft, nontender EXT 1+ edema of bilateral upper and lower extremities NEURO groggy, slurred speech but answers appropriately  SKIN: some blisters on hands and feet  Results for orders placed or performed during the hospital encounter of 07/04/20 (from the past 48 hour(s))  CBG monitoring, ED     Status: None   Collection Time: 07/04/20  8:53 PM  Result Value Ref Range   Glucose-Capillary 83 70 - 99 mg/dL    Comment: Glucose reference range applies only to samples taken after fasting for at least 8 hours.  Basic metabolic panel     Status: Abnormal   Collection Time: 07/05/20  4:12 AM  Result Value Ref Range   Sodium 133 (L) 135 - 145 mmol/L   Potassium 4.5 3.5 - 5.1 mmol/L   Chloride 101 98 - 111 mmol/L   CO2  18 (L) 22 - 32 mmol/L   Glucose, Bld 94 70 - 99 mg/dL    Comment: Glucose reference range applies only to samples taken after fasting for at least 8 hours.   BUN 91 (H) 6 - 20 mg/dL   Creatinine, Ser 6.43 (H) 0.61 - 1.24 mg/dL   Calcium 7.2 (L) 8.9 - 10.3 mg/dL   GFR, Estimated 19 (L) >60 mL/min    Comment: (NOTE) Calculated using the CKD-EPI Creatinine Equation (2021)    Anion gap 14 5 - 15    Comment: Performed at [facility], [address], [place], [place] [address]  CK     Status: Abnormal   Collection Time: 07/05/20  4:12 AM  Result Value Ref Range   Total CK 43,297 (H) 49 - 397 U/L    Comment: RESULTS CONFIRMED BY MANUAL DILUTION Performed at [facility], [address], [place], [place] [address]   CBC with Differential/Platelet     Status: Abnormal   Collection Time: 07/05/20  4:12  AM  Result Value Ref Range   WBC 15.4 (H) 4.0 - 10.5 K/uL   RBC 3.78 (L) 4.22 - 5.81 MIL/uL   Hemoglobin 11.8 (L) 13.0 - 17.0 g/dL   HCT 60.6 (L) 30.1 - 60.1 %   MCV 89.9 80.0 - 100.0 fL   MCH 31.2 26.0 - 34.0 pg   MCHC 34.7 30.0 - 36.0 g/dL   RDW 09.3 23.5 - 57.3 %   Platelets 122 (L) 150 - 400 K/uL   nRBC 0.0 0.0 - 0.2 %   Neutrophils Relative % 87 %   Neutro Abs 13.5 (H) 1.7 - 7.7 K/uL   Lymphocytes Relative 3 %   Lymphs Abs 0.4 (L) 0.7 - 4.0 K/uL   Monocytes Relative 9 %   Monocytes Absolute 1.3 (H) 0.1 - 1.0 K/uL   Eosinophils Relative 0 %   Eosinophils Absolute 0.0 0.0 - 0.5 K/uL   Basophils Relative 0 %   Basophils Absolute 0.0 0.0 - 0.1 K/uL   Immature Granulocytes 1 %   Abs Immature Granulocytes 0.10 (H) 0.00 - 0.07 K/uL    Comment: Performed at [facility], [address], [place], [place] [address]  Hepatic function panel     Status: Abnormal   Collection Time: 07/05/20  4:12 AM  Result Value Ref Range   Total Protein 5.6 (L) 6.5 - 8.1 g/dL   Albumin 3.2 (L) 3.5 - 5.0 g/dL   AST 4,270 (H) 15 - 41 U/L   ALT 525 (H) 0 - 44 U/L   Alkaline Phosphatase 69 38 - 126 U/L   Total Bilirubin 1.3 (H) 0.3 - 1.2 mg/dL   Bilirubin, Direct 0.2 0.0 - 0.2 mg/dL   Indirect Bilirubin 1.1 (H) 0.3 - 0.9 mg/dL    Comment: Performed at [facility], [address], [place], [place] [address]  Protime-INR     Status: Abnormal   Collection Time: 07/05/20  4:12 AM  Result Value Ref Range   Prothrombin Time 15.6 (H) 11.4 - 15.2 seconds   INR 1.3 (H) 0.8 - 1.2    Comment: (NOTE) INR goal varies based on device and disease states. Performed at [facility], [address], [place], [place] [address]   Osmolality     Status: Abnormal   Collection Time: 07/05/20  9:52 AM  Result Value Ref Range   Osmolality 318 (H) 275 - 295 mOsm/kg    Comment:  Performed at [facility], [address], [place], [place] [address]  Basic metabolic panel     Status: Abnormal    Collection Time: 07/05/20  9:52 AM  Result Value Ref Range   Sodium 136 135 - 145 mmol/L   Potassium 4.7 3.5 - 5.1 mmol/L   Chloride 105 98 - 111 mmol/L   CO2 17 (L) 22 - 32 mmol/L   Glucose, Bld 92 70 - 99 mg/dL    Comment: Glucose reference range applies only to samples taken after fasting for at least 8 hours.   BUN 89 (H) 6 - 20 mg/dL   Creatinine, Ser 1.91 (H) 0.61 - 1.24 mg/dL   Calcium 7.3 (L) 8.9 - 10.3 mg/dL   GFR, Estimated 19 (L) >60 mL/min    Comment: (NOTE) Calculated using the CKD-EPI Creatinine Equation (2021)    Anion gap 14 5 - 15    Comment: Performed at [facility], [address] [place], [place] [address]  RPR     Status: None   Collection Time: 07/05/20  9:52 AM  Result Value Ref Range   RPR Ser Ql NON REACTIVE NON REACTIVE    Comment: Performed at [facility], [address], [place], [place] [address]  Glucose, capillary     Status: None   Collection Time: 07/05/20 12:08 PM  Result Value Ref Range   Glucose-Capillary 74 70 - 99 mg/dL    Comment: Glucose reference range applies only to samples taken after fasting for at least 8 hours.  Urinalysis, Routine w reflex microscopic     Status: Abnormal   Collection Time: 07/05/20  2:00 PM  Result Value Ref Range   Color, Urine YELLOW YELLOW   APPearance HAZY (A) CLEAR   Specific Gravity, Urine 1.010 1.005 - 1.030   pH 5.0 5.0 - 8.0   Glucose, UA NEGATIVE NEGATIVE mg/dL   Hgb urine dipstick LARGE (A) NEGATIVE   Bilirubin Urine NEGATIVE NEGATIVE   Ketones, ur 5 (A) NEGATIVE mg/dL   Protein, ur 30 (A) NEGATIVE mg/dL   Nitrite NEGATIVE NEGATIVE   Leukocytes,Ua NEGATIVE NEGATIVE   RBC / HPF >50 (H) 0 - 5 RBC/hpf   WBC, UA 6-10 0 - 5 WBC/hpf   Bacteria, UA RARE (A) NONE SEEN   Squamous Epithelial / LPF 0-5 0 - 5   Mucus PRESENT     Comment: Performed at [facility], [address], [place], [place] [address]  Glucose, capillary     Status: None   Collection Time: 07/05/20  5:36 PM   Result Value Ref Range   Glucose-Capillary 92 70 - 99 mg/dL    Comment: Glucose reference range applies only to samples taken after fasting for at least 8 hours.  Basic metabolic panel     Status: Abnormal   Collection Time: 07/05/20  6:54 PM  Result Value Ref Range   Sodium 140 135 - 145 mmol/L   Potassium 4.3 3.5 - 5.1 mmol/L   Chloride 108 98 - 111 mmol/L   CO2 20 (L) 22 - 32 mmol/L   Glucose, Bld 90 70 - 99 mg/dL    Comment: Glucose reference range applies only to samples taken after fasting for at least 8 hours.   BUN 85 (H) 6 - 20 mg/dL   Creatinine, Ser 8.46 (H) 0.61 - 1.24 mg/dL   Calcium 7.5 (L) 8.9 - 10.3 mg/dL   GFR, Estimated 20 (L) >60 mL/min  Comment: (NOTE) Calculated using the CKD-EPI Creatinine Equation (2021)    Anion gap 12 5 - 15    Comment: Performed at [facility], [address] [place], [place] [address]  Glucose, capillary     Status: None   Collection Time: 07/05/20 11:51 PM  Result Value Ref Range   Glucose-Capillary 90 70 - 99 mg/dL    Comment: Glucose reference range applies only to samples taken after fasting for at least 8 hours.  CK     Status: Abnormal   Collection Time: 07/06/20  4:21 AM  Result Value Ref Range   Total CK 15,385 (H) 49 - 397 U/L    Comment: RESULTS CONFIRMED BY MANUAL DILUTION Performed at [facility], [address], [place], [place] [address]   Basic metabolic panel     Status: Abnormal   Collection Time: 07/06/20  4:21 AM  Result Value Ref Range   Sodium 143 135 - 145 mmol/L   Potassium 4.0 3.5 - 5.1 mmol/L   Chloride 112 (H) 98 - 111 mmol/L   CO2 20 (L) 22 - 32 mmol/L   Glucose, Bld 95 70 - 99 mg/dL    Comment: Glucose reference range applies only to samples taken after fasting for at least 8 hours.   BUN 75 (H) 6 - 20 mg/dL   Creatinine, Ser 4.69 (H) 0.61 - 1.24 mg/dL   Calcium 7.9 (L) 8.9 - 10.3 mg/dL   GFR, Estimated 22 (L) >60 mL/min    Comment: (NOTE) Calculated using the CKD-EPI Creatinine  Equation (2021)    Anion gap 11 5 - 15    Comment: Performed at [facility], [address], [place], [place] [address]  Glucose, capillary     Status: None   Collection Time: 07/06/20  6:33 AM  Result Value Ref Range   Glucose-Capillary 83 70 - 99 mg/dL    Comment: Glucose reference range applies only to samples taken after fasting for at least 8 hours.  Glucose, capillary     Status: None   Collection Time: 07/06/20 11:44 AM  Result Value Ref Range   Glucose-Capillary 78 70 - 99 mg/dL    Comment: Glucose reference range applies only to samples taken after fasting for at least 8 hours.  Glucose, capillary     Status: Abnormal   Collection Time: 07/06/20  5:33 PM  Result Value Ref Range   Glucose-Capillary 69 (L) 70 - 99 mg/dL    Comment: Glucose reference range applies only to samples taken after fasting for at least 8 hours.  Glucose, capillary     Status: None   Collection Time: 07/06/20  5:49 PM  Result Value Ref Range   Glucose-Capillary 91 70 - 99 mg/dL    Comment: Glucose reference range applies only to samples taken after fasting for at least 8 hours.    CT HEAD WO CONTRAST  Result Date: 07/06/2020 CLINICAL DATA:  Mental status changes of unknown cause EXAM: CT HEAD WITHOUT CONTRAST TECHNIQUE: Contiguous axial images were obtained from the base of the skull through the vertex without intravenous contrast. Sagittal and coronal MPR images reconstructed from axial data set. COMPARISON:  None FINDINGS: Brain: Normal ventricular morphology. No midline shift or mass effect. Normal appearance of brain parenchyma. No intracranial hemorrhage, mass lesion, or evidence of acute infarction. No extra-axial fluid collections. Vascular: No hyperdense vessels Skull: Intact Sinuses/Orbits: Clear Other: N/A IMPRESSION: Normal exam. Electronically Signed   By: Ulyses Southward  M.D.   On: 07/06/2020 15:00   DG CHEST PORT 1 VIEW  Result Date: 07/05/2020 CLINICAL DATA:  Metabolic encephalopathy.   Drug use. EXAM: PORTABLE CHEST 1 VIEW COMPARISON:  Yesterday FINDINGS: Normal heart size and mediastinal contours. No acute infiltrate or edema. No effusion or pneumothorax. No acute osseous findings. Thoracic dextrocurvature. IMPRESSION: Negative portable chest. Electronically Signed   By: Marnee SpringJonathon  Watts M.D.   On: 07/05/2020 07:30   DG Foot 2 Views Right  Result Date: 07/05/2020 CLINICAL DATA:  30 year old male with right foot erythema. EXAM: RIGHT FOOT - 2 VIEW COMPARISON:  None. FINDINGS: There is no evidence of fracture or dislocation. There is no evidence of arthropathy or other focal bone abnormality. Os vesalinum is noted. Soft tissues are unremarkable. No radiopaque foreign bodies. IMPRESSION: No acute fracture or malalignment. The soft tissues are within normal limits. Electronically Signed   By: Marliss Cootsylan  Suttle MD   On: 07/05/2020 08:25   ECHOCARDIOGRAM COMPLETE  Result Date: 07/05/2020    ECHOCARDIOGRAM REPORT   Patient Name:   Barbra SarksHUNTER Gauntt Date of Exam: 07/05/2020 Medical Rec #:  [id number]        Height: Accession #:    [id number]       Weight: Date of Birth:  28-Jul-1990        BSA: Patient Age:    30 years         BP:           139/81 mmHg Patient Gender: M                HR:           115 bpm. Exam Location:  Inpatient Procedure: 2D Echo, Cardiac Doppler and Color Doppler Indications:    Bacteremia R78.81  History:        Patient has no prior history of Echocardiogram examinations.  Sonographer:    Eulah PontSarah Pirrotta RDCS Referring Phys: [id number] [facility] IMPRESSIONS  1. Left ventricular ejection fraction, by estimation, is 60 to 65%. The left ventricle has normal function. The left ventricle has no regional wall motion abnormalities. Left ventricular diastolic parameters were normal.  2. Right ventricular systolic function is normal. The right ventricular size is normal.  3. The mitral valve is normal in structure. No evidence of mitral valve regurgitation. No evidence of mitral stenosis.   4. The aortic valve is normal in structure. Aortic valve regurgitation is not visualized. No aortic stenosis is present.  5. The inferior vena cava is normal in size with greater than 50% respiratory variability, suggesting right atrial pressure of 3 mmHg. Conclusion(s)/Recommendation(s): No evidence of valvular vegetations on this transthoracic echocardiogram. Would recommend a transesophageal echocardiogram to exclude infective endocarditis if clinically indicated. FINDINGS  Left Ventricle: Left ventricular ejection fraction, by estimation, is 60 to 65%. The left ventricle has normal function. The left ventricle has no regional wall motion abnormalities. The left ventricular internal cavity size was normal in size. There is  no left ventricular hypertrophy. Left ventricular diastolic parameters were normal. Normal left ventricular filling pressure. Right Ventricle: The right ventricular size is normal. No increase in right ventricular wall thickness. Right ventricular systolic function is normal. Left Atrium: Left atrial size was normal in size. Right Atrium: Right atrial size was normal in size. Pericardium: There is no evidence of pericardial effusion. Mitral Valve: The mitral valve is normal in structure. No evidence of mitral valve regurgitation. No evidence of mitral valve stenosis. Tricuspid Valve: The tricuspid valve is normal in  structure. Tricuspid valve regurgitation is not demonstrated. No evidence of tricuspid stenosis. Aortic Valve: The aortic valve is normal in structure. Aortic valve regurgitation is not visualized. No aortic stenosis is present. Pulmonic Valve: The pulmonic valve was normal in structure. Pulmonic valve regurgitation is not visualized. No evidence of pulmonic stenosis. Aorta: The aortic root is normal in size and structure. Venous: The inferior vena cava is normal in size with greater than 50% respiratory variability, suggesting right atrial pressure of 3 mmHg. IAS/Shunts: No  atrial level shunt detected by color flow Doppler.  LEFT VENTRICLE PLAX 2D LVIDd:         4.60 cm  Diastology LVIDs:         2.60 cm  LV e' medial:    15.70 cm/s LV PW:         1.10 cm  LV E/e' medial:  6.1 LV IVS:        1.00 cm  LV e' lateral:   14.50 cm/s LVOT diam:     2.30 cm  LV E/e' lateral: 6.6 LV SV:         120 LVOT Area:     4.15 cm  RIGHT VENTRICLE RV S prime:     17.50 cm/s TAPSE (M-mode): 2.7 cm LEFT ATRIUM             RIGHT ATRIUM LA diam:        2.50 cm RA Area:     13.00 cm LA Vol (A2C):   28.8 ml RA Volume:   28.40 ml LA Vol (A4C):   30.4 ml LA Biplane Vol: 30.6 ml  AORTIC VALVE LVOT Vmax:   132.00 cm/s LVOT Vmean:  102.000 cm/s LVOT VTI:    0.290 m  AORTA Ao Root diam: 3.70 cm Ao Asc diam:  3.00 cm MITRAL VALVE MV Area (PHT): 3.37 cm    SHUNTS MV Decel Time: 225 msec    Systemic VTI:  0.29 m MV E velocity: 95.10 cm/s  Systemic Diam: 2.30 cm MV A velocity: 84.90 cm/s MV E/A ratio:  1.12 Mihai Croitoru MD Electronically signed by Thurmon Fair MD Signature Date/Time: 07/05/2020/4:53:37 PM    Final     Assessment/Plan  1.  AKI (nonoliguric): baseline creatinine is 1.03 as of October 2021.  CT abd/ pelvis 07/04/20 without obstruction.  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  A biopsy wouldn't be necessary because it wouldn't change the need for IV antibiotic therapy.  Will send some complements, ANA, and ANCA.  Agree with IVF resuscitation.  Cr is already coming down and CK is dramatically down to 15,000 today so I anticipate that he'll need IVFs maybe 24-48 more hours.  His UOP is robust.  No need for dialysis at present.   2.  Rhabdomyolysis: getting NS @ 125/ hr.  CK 43000--> 15000 today.    3.  MSSA and strep bacteremia: on Ancef, TTE without vegetations, plan for TEE  4.  EtOH abuse/ withdrawal: on CIWA protocol.    5.   Polysubstance abuse: was in rehab and got a weekend pass when he had this most recent relapse.     Bufford Buttner 07/06/2020, 6:39 PM
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
## 2020-07-06 NOTE — Progress Notes (Signed)
PROGRESS NOTE    Zachary Hurst   ZOX:[id number]  DOB: March 27, 1991  DOA: 07/04/2020 PCP: Pcp, No   Brief Narrative:  Zachary Hurst is a 30 year old male with a history of heroin and cocaine abuse who was brought to the hospital by EMS with severe acute confusion his girlfriend called them.  According to the girlfriend the patient had been using heroin and cocaine that day and subsequently became extremely aggressive yelling and screaming and flailing his extremities. Further history obtained from the patient's mother.  She states that the patient has been in a drug rehab center for 2 months and was released on Friday on a pass for the weekend.  He visited his mother on Saturday who suspected that he may be high but did not notice any track marks at that time. The patient's mother further states that about 2 years ago the patient was admitted to [facility] after a similar episode of severe agitation when he used heroin.  She states that IV Ativan was ineffective in reducing his agitation and he was eventually placed in a medically induced coma.  He recuperated and was discharged within 6 days of being admitted.  He has been admitted to an inpatient rehab facility on 3 separate occasions but has relapsed each time.  Even Geodon in the ED for his severe agitation and this helped him.  Subjective: No complaints. Quite agitated on my eval and in 4 point restraints. On re-eval after Ativan, he is alseep    Assessment & Plan:   Principal Problem:   Acute encephalopathy - multifactorial-he currently has bacteremia with staph and strep, AKI, and has used heroin which may have been mixed with fentanyl and other substances along with cocaine a few days ago -Continue as needed Ativan for his agitation-I have also added Haldol PRN -he is on telemetry and we will monitor his QT Active Problems:  Staph and streptococcal bacteremia with leukocytosis -Likely related to IV drug abuse-he also snorts  cocaine and may have some breakdown of the nasal mucosa-he also has numerous scabs on his feet and legs which may be entry points for bacteria -Continue cefazolin -2D echo reveals no vegetations-she will need a TEE -Appreciate ID consult  Drug abuse -With IV drugs and snorting drugs -Mother does not have any more money to pay for inpatient rehab facilities -He is a candidate for Suboxone or methadone  Rhabdomyolysis with AKI and metabolic acidosis -Suspect his AKI is related to ATN -His rhabdomyolysis may be related to the severe agitation and flailing of extremities noted by his girlfriend who sent a video of this to his mother-his mother feels that this is possibly related to fentanyl mixed in with the heroin that he was using -Continue aggressive IV hydration and follow-CK levels are improving but creatinine is still about the same -We will ask for nephrology eval     Elevated LFTs -Likely related to sepsis-his mom states that he does not drink alcohol -AST 1085 and ALT 443-continue to follow    Hepatitis C antibody positive in blood   Time spent in minutes: 50-greater than 50% of the time taken in calling consults and speaking with mother DVT prophylaxis: SCDs Start: 07/04/20 0224  Code Status: Full code Family Communication: Mother Level of Care: Level of care: Progressive Disposition Plan:  Status is: Inpatient  Remains inpatient appropriate because:IV treatments appropriate due to intensity of illness or inability to take PO   Dispo: The patient is from: Home  Anticipated d/c is to: Home              Anticipated d/c date is: > 3 days              Patient currently is not medically stable to d/c.   Difficult to place patient No      Consultants:   ID Procedures:   2D echo Antimicrobials:  Anti-infectives (From admission, onward)   Start     Dose/Rate Route Frequency Ordered Stop   07/04/20 2345  ceFAZolin (ANCEF) IVPB 2g/100 mL premix        2  g 200 mL/hr over 30 Minutes Intravenous Every 12 hours 07/04/20 2330     07/04/20 2330  ceFAZolin (ANCEF) IVPB 2g/100 mL premix  Status:  Discontinued        2 g 200 mL/hr over 30 Minutes Intravenous Every 8 hours 07/04/20 2328 07/04/20 2329       Objective: Vitals:   07/06/20 0600 07/06/20 0729 07/06/20 1100 07/06/20 1509  BP:  123/80 122/87 (P) 136/85  Pulse:  92 94 (P) 99  Resp:  18 17 (P) 18  Temp:  97.8 F (36.6 C) 97.8 F (36.6 C) (P) 97.9 F (36.6 C)  TempSrc:  Axillary Axillary (P) Axillary  SpO2:  100% 100% (P) 100%  Weight: 96.3 kg       Intake/Output Summary (Last 24 hours) at 07/06/2020 1658 Last data filed at 07/06/2020 1400 Gross per 24 hour  Intake 3139.72 ml  Output 2850 ml  Net 289.72 ml   Filed Weights   07/05/20 0800 07/06/20 0600  Weight: 95 kg 96.3 kg    Examination: General exam: Appears uncomfortable  HEENT: PERRLA, oral mucosa moist, no sclera icterus or thrush Respiratory system: Clear to auscultation. Respiratory effort normal. Cardiovascular system: S1 & S2 heard, RRR.   Gastrointestinal system: Abdomen soft, non-tender, nondistended. Normal bowel sounds. Central nervous system: Alert and oriented to person and place only. No focal neurological deficits. Extremities: No cyanosis, clubbing -mild edema of his hands noted Skin: He has scabs on his feet and legs and track marks on his arms Psychiatry: Agitated    Data Reviewed: I have personally reviewed following labs and imaging studies  CBC: Recent Labs  Lab 07/04/20 0113 07/04/20 0251 07/05/20 0412  WBC 25.5* 24.5* 15.4*  NEUTROABS 22.5* 20.7* 13.5*  HGB 14.3 14.5 11.8*  HCT 40.1 40.7 34.0*  MCV 86.1 86.8 89.9  PLT 203 195 122*   Basic Metabolic Panel: Recent Labs  Lab 07/04/20 0755 07/04/20 1458 07/05/20 0412 07/05/20 0952 07/05/20 1854 07/06/20 0421  NA 133*  --  133* 136 140 143  K 4.8  --  4.5 4.7 4.3 4.0  CL 95*  --  101 105 108 112*  CO2 19*  --  18* 17* 20*  20*  GLUCOSE 90  --  94 92 90 95  BUN 73*  --  91* 89* 85* 75*  CREATININE 3.37*  --  4.14* 4.15* 3.90* 3.71*  CALCIUM 7.8*  --  7.2* 7.3* 7.5* 7.9*  MG  --  2.8*  --   --   --   --   PHOS  --  6.9*  --   --   --   --    GFR: CrCl cannot be calculated (Unknown ideal weight.). Liver Function Tests: Recent Labs  Lab 07/04/20 0113 07/04/20 0251 07/04/20 1458 07/05/20 0412  AST 802* 928* 1,085* 1,100*  ALT 359* 384* 443* 525*  ALKPHOS 82 83 63 69  BILITOT 2.5* 3.0* 1.6* 1.3*  PROT 7.7 7.8 6.3* 5.6*  ALBUMIN 4.8 4.7 3.7 3.2*   No results for input(s): LIPASE, AMYLASE in the last 168 hours. No results for input(s): AMMONIA in the last 168 hours. Coagulation Profile: Recent Labs  Lab 07/04/20 0639 07/05/20 0412  INR 1.3* 1.3*   Cardiac Enzymes: Recent Labs  Lab 07/04/20 0251 07/05/20 0412 07/06/20 0421  CKTOTAL 28,714* 43,297* 15,385*   BNP (last 3 results) No results for input(s): PROBNP in the last 8760 hours. HbA1C: No results for input(s): HGBA1C in the last 72 hours. CBG: Recent Labs  Lab 07/05/20 1208 07/05/20 1736 07/05/20 2351 07/06/20 0633 07/06/20 1144  GLUCAP 74 92 90 83 78   Lipid Profile: No results for input(s): CHOL, HDL, LDLCALC, TRIG, CHOLHDL, LDLDIRECT in the last 72 hours. Thyroid Function Tests: No results for input(s): TSH, T4TOTAL, FREET4, T3FREE, THYROIDAB in the last 72 hours. Anemia Panel: No results for input(s): VITAMINB12, FOLATE, FERRITIN, TIBC, IRON, RETICCTPCT in the last 72 hours. Urine analysis:    Component Value Date/Time   COLORURINE YELLOW 07/05/2020 1400   APPEARANCEUR HAZY (A) 07/05/2020 1400   LABSPEC 1.010 07/05/2020 1400   PHURINE 5.0 07/05/2020 1400   GLUCOSEU NEGATIVE 07/05/2020 1400   HGBUR LARGE (A) 07/05/2020 1400   BILIRUBINUR NEGATIVE 07/05/2020 1400   KETONESUR 5 (A) 07/05/2020 1400   PROTEINUR 30 (A) 07/05/2020 1400   NITRITE NEGATIVE 07/05/2020 1400   LEUKOCYTESUR NEGATIVE 07/05/2020 1400   Sepsis  Labs: @LABRCNTIP (procalcitonin:4,lacticidven:4) ) Recent Results (from the past 240 hour(s))  Resp Panel by RT-PCR (Flu A&B, Covid) Nasopharyngeal Swab     Status: None   Collection Time: 07/04/20  3:08 AM   Specimen: Nasopharyngeal Swab; Nasopharyngeal(NP) swabs in vial transport medium  Result Value Ref Range Status   SARS Coronavirus 2 by RT PCR NEGATIVE NEGATIVE Final    Comment: (NOTE) SARS-CoV-2 target nucleic acids are NOT DETECTED.  The SARS-CoV-2 RNA is generally detectable in upper respiratory specimens during the acute phase of infection. The lowest concentration of SARS-CoV-2 viral copies this assay can detect is 138 copies/mL. A negative result does not preclude SARS-Cov-2 infection and should not be used as the sole basis for treatment or other patient management decisions. A negative result may occur with  improper specimen collection/handling, submission of specimen other than nasopharyngeal swab, presence of viral mutation(s) within the areas targeted by this assay, and inadequate number of viral copies(<138 copies/mL). A negative result must be combined with clinical observations, patient history, and epidemiological information. The expected result is Negative.  Fact Sheet for Patients:  07/06/20  Fact Sheet for Healthcare Providers:  BloggerCourse.com  This test is no t yet approved or cleared by the [place] FDA and  has been authorized for detection and/or diagnosis of SARS-CoV-2 by FDA under an Emergency Use Authorization (EUA). This EUA will remain  in effect (meaning this test can be used) for the duration of the COVID-19 declaration under Section 564(b)(1) of the Act, 21 U.S.C.section 360bbb-3(b)(1), unless the authorization is terminated  or revoked sooner.       Influenza A by PCR NEGATIVE NEGATIVE Final   Influenza B by PCR NEGATIVE NEGATIVE Final    Comment: (NOTE) The Xpert Xpress  SARS-CoV-2/FLU/RSV plus assay is intended as an aid in the diagnosis of influenza from Nasopharyngeal swab specimens and should not be used as a sole basis for treatment. Nasal washings and aspirates are unacceptable for Xpert  Xpress SARS-CoV-2/FLU/RSV testing.  Fact Sheet for Patients: BloggerCourse.com  Fact Sheet for Healthcare Providers: SeriousBroker.it  This test is not yet approved or cleared by the [place] FDA and has been authorized for detection and/or diagnosis of SARS-CoV-2 by FDA under an Emergency Use Authorization (EUA). This EUA will remain in effect (meaning this test can be used) for the duration of the COVID-19 declaration under Section 564(b)(1) of the Act, 21 U.S.C. section 360bbb-3(b)(1), unless the authorization is terminated or revoked.  Performed at [facility], [address], [place], [place] [address]   Culture, blood (routine x 2)     Status: Abnormal (Preliminary result)   Collection Time: 07/04/20  6:13 AM   Specimen: BLOOD  Result Value Ref Range Status   Specimen Description BLOOD RIGHT ANTECUBITAL  Final   Special Requests   Final    BOTTLES DRAWN AEROBIC AND ANAEROBIC Blood Culture adequate volume   Culture  Setup Time   Final    GRAM POSITIVE COCCI IN CLUSTERS IN BOTH AEROBIC AND ANAEROBIC BOTTLES CRITICAL RESULT CALLED TO, READ BACK BY AND VERIFIED WITH: J LEDFORD PHARMD 07/04/20 2316 JDW    Culture (A)  Final    STAPHYLOCOCCUS AUREUS CULTURE REINCUBATED FOR BETTER GROWTH Performed at [facility], [address] [place], [place] [address]    Report Status PENDING  Incomplete   Organism ID, Bacteria STAPHYLOCOCCUS AUREUS  Final      Susceptibility   Staphylococcus aureus - MIC*    CIPROFLOXACIN <=0.5 SENSITIVE Sensitive     ERYTHROMYCIN >=8 RESISTANT Resistant     GENTAMICIN <=0.5 SENSITIVE Sensitive     OXACILLIN <=0.25 SENSITIVE Sensitive     TETRACYCLINE <=1  SENSITIVE Sensitive     VANCOMYCIN <=0.5 SENSITIVE Sensitive     TRIMETH/SULFA <=10 SENSITIVE Sensitive     CLINDAMYCIN RESISTANT Resistant     RIFAMPIN <=0.5 SENSITIVE Sensitive     Inducible Clindamycin POSITIVE Resistant     * STAPHYLOCOCCUS AUREUS  Blood Culture ID Panel (Reflexed)     Status: Abnormal   Collection Time: 07/04/20  6:13 AM  Result Value Ref Range Status   Enterococcus faecalis NOT DETECTED NOT DETECTED Final   Enterococcus Faecium NOT DETECTED NOT DETECTED Final   Listeria monocytogenes NOT DETECTED NOT DETECTED Final   Staphylococcus species DETECTED (A) NOT DETECTED Final    Comment: CRITICAL RESULT CALLED TO, READ BACK BY AND VERIFIED WITH: J LEDFORD PHARMD 07/04/20 2316 JDW    Staphylococcus aureus (BCID) DETECTED (A) NOT DETECTED Final    Comment: Methicillin (oxacillin) susceptible Staphylococcus aureus (MSSA). Preferred therapy is anti staphylococcal beta lactam antibiotic (Cefazolin or Nafcillin), unless clinically contraindicated. CRITICAL RESULT CALLED TO, READ BACK BY AND VERIFIED WITH: J LEDFORD [facility] 07/04/20 2316 JDW    Staphylococcus epidermidis DETECTED (A) NOT DETECTED Final    Comment: CRITICAL RESULT CALLED TO, READ BACK BY AND VERIFIED WITH: J LEDFORD PHARMD 07/04/20 2316 JDW    Staphylococcus lugdunensis NOT DETECTED NOT DETECTED Final   Streptococcus species DETECTED (A) NOT DETECTED Final    Comment: Not Enterococcus species, Streptococcus agalactiae, Streptococcus pyogenes, or Streptococcus pneumoniae. CRITICAL RESULT CALLED TO, READ BACK BY AND VERIFIED WITH: J LEDFORD PHARMD 07/04/20 2316 JDW    Streptococcus agalactiae NOT DETECTED NOT DETECTED Final   Streptococcus pneumoniae NOT DETECTED NOT DETECTED Final   Streptococcus pyogenes NOT DETECTED NOT DETECTED Final   A.calcoaceticus-baumannii NOT DETECTED NOT DETECTED Final   Bacteroides fragilis NOT DETECTED NOT DETECTED Final  Enterobacterales NOT DETECTED NOT DETECTED Final    Enterobacter cloacae complex NOT DETECTED NOT DETECTED Final   Escherichia coli NOT DETECTED NOT DETECTED Final   Klebsiella aerogenes NOT DETECTED NOT DETECTED Final   Klebsiella oxytoca NOT DETECTED NOT DETECTED Final   Klebsiella pneumoniae NOT DETECTED NOT DETECTED Final   Proteus species NOT DETECTED NOT DETECTED Final   Salmonella species NOT DETECTED NOT DETECTED Final   Serratia marcescens NOT DETECTED NOT DETECTED Final   Haemophilus influenzae NOT DETECTED NOT DETECTED Final   Neisseria meningitidis NOT DETECTED NOT DETECTED Final   Pseudomonas aeruginosa NOT DETECTED NOT DETECTED Final   Stenotrophomonas maltophilia NOT DETECTED NOT DETECTED Final   Candida albicans NOT DETECTED NOT DETECTED Final   Candida auris NOT DETECTED NOT DETECTED Final   Candida glabrata NOT DETECTED NOT DETECTED Final   Candida krusei NOT DETECTED NOT DETECTED Final   Candida parapsilosis NOT DETECTED NOT DETECTED Final   Candida tropicalis NOT DETECTED NOT DETECTED Final   Cryptococcus neoformans/gattii NOT DETECTED NOT DETECTED Final   Methicillin resistance mecA/C NOT DETECTED NOT DETECTED Final   Meth resistant mecA/C and MREJ NOT DETECTED NOT DETECTED Final    Comment: Performed at [facility], [address] [place], [place] [address]  Culture, blood (routine x 2)     Status: None (Preliminary result)   Collection Time: 07/04/20  7:55 AM   Specimen: BLOOD RIGHT HAND  Result Value Ref Range Status   Specimen Description BLOOD RIGHT HAND  Final   Special Requests   Final    BOTTLES DRAWN AEROBIC AND ANAEROBIC Blood Culture adequate volume   Culture   Final    NO GROWTH 2 DAYS Performed at [facility], [address] [place], [place] [address]    Report Status PENDING  Incomplete         Radiology Studies: CT HEAD WO CONTRAST  Result Date: 07/06/2020 CLINICAL DATA:  Mental status changes of unknown cause EXAM: CT HEAD WITHOUT CONTRAST TECHNIQUE: Contiguous axial  images were obtained from the base of the skull through the vertex without intravenous contrast. Sagittal and coronal MPR images reconstructed from axial data set. COMPARISON:  None FINDINGS: Brain: Normal ventricular morphology. No midline shift or mass effect. Normal appearance of brain parenchyma. No intracranial hemorrhage, mass lesion, or evidence of acute infarction. No extra-axial fluid collections. Vascular: No hyperdense vessels Skull: Intact Sinuses/Orbits: Clear Other: N/A IMPRESSION: Normal exam. Electronically Signed   By: Ulyses SouthwardMark  Boles M.D.   On: 07/06/2020 15:00   DG CHEST PORT 1 VIEW  Result Date: 07/05/2020 CLINICAL DATA:  Metabolic encephalopathy.  Drug use. EXAM: PORTABLE CHEST 1 VIEW COMPARISON:  Yesterday FINDINGS: Normal heart size and mediastinal contours. No acute infiltrate or edema. No effusion or pneumothorax. No acute osseous findings. Thoracic dextrocurvature. IMPRESSION: Negative portable chest. Electronically Signed   By: Marnee SpringJonathon  Watts M.D.   On: 07/05/2020 07:30   DG Foot 2 Views Right  Result Date: 07/05/2020 CLINICAL DATA:  30 year old male with right foot erythema. EXAM: RIGHT FOOT - 2 VIEW COMPARISON:  None. FINDINGS: There is no evidence of fracture or dislocation. There is no evidence of arthropathy or other focal bone abnormality. Os vesalinum is noted. Soft tissues are unremarkable. No radiopaque foreign bodies. IMPRESSION: No acute fracture or malalignment. The soft tissues are within normal limits. Electronically Signed   By: Marliss Cootsylan  Suttle MD   On: 07/05/2020 08:25   ECHOCARDIOGRAM COMPLETE  Result Date: 07/05/2020  ECHOCARDIOGRAM REPORT   Patient Name:   Zachary Hurst Date of Exam: 07/05/2020 Medical Rec #:  [id number]        Height: Accession #:    [id number]       Weight: Date of Birth:  Sep 19, 1990        BSA: Patient Age:    30 years         BP:           139/81 mmHg Patient Gender: M                HR:           115 bpm. Exam Location:  Inpatient  Procedure: 2D Echo, Cardiac Doppler and Color Doppler Indications:    Bacteremia R78.81  History:        Patient has no prior history of Echocardiogram examinations.  Sonographer:    Eulah Pont RDCS Referring Phys: [id number] [facility] IMPRESSIONS  1. Left ventricular ejection fraction, by estimation, is 60 to 65%. The left ventricle has normal function. The left ventricle has no regional wall motion abnormalities. Left ventricular diastolic parameters were normal.  2. Right ventricular systolic function is normal. The right ventricular size is normal.  3. The mitral valve is normal in structure. No evidence of mitral valve regurgitation. No evidence of mitral stenosis.  4. The aortic valve is normal in structure. Aortic valve regurgitation is not visualized. No aortic stenosis is present.  5. The inferior vena cava is normal in size with greater than 50% respiratory variability, suggesting right atrial pressure of 3 mmHg. Conclusion(s)/Recommendation(s): No evidence of valvular vegetations on this transthoracic echocardiogram. Would recommend a transesophageal echocardiogram to exclude infective endocarditis if clinically indicated. FINDINGS  Left Ventricle: Left ventricular ejection fraction, by estimation, is 60 to 65%. The left ventricle has normal function. The left ventricle has no regional wall motion abnormalities. The left ventricular internal cavity size was normal in size. There is  no left ventricular hypertrophy. Left ventricular diastolic parameters were normal. Normal left ventricular filling pressure. Right Ventricle: The right ventricular size is normal. No increase in right ventricular wall thickness. Right ventricular systolic function is normal. Left Atrium: Left atrial size was normal in size. Right Atrium: Right atrial size was normal in size. Pericardium: There is no evidence of pericardial effusion. Mitral Valve: The mitral valve is normal in structure. No evidence of mitral valve  regurgitation. No evidence of mitral valve stenosis. Tricuspid Valve: The tricuspid valve is normal in structure. Tricuspid valve regurgitation is not demonstrated. No evidence of tricuspid stenosis. Aortic Valve: The aortic valve is normal in structure. Aortic valve regurgitation is not visualized. No aortic stenosis is present. Pulmonic Valve: The pulmonic valve was normal in structure. Pulmonic valve regurgitation is not visualized. No evidence of pulmonic stenosis. Aorta: The aortic root is normal in size and structure. Venous: The inferior vena cava is normal in size with greater than 50% respiratory variability, suggesting right atrial pressure of 3 mmHg. IAS/Shunts: No atrial level shunt detected by color flow Doppler.  LEFT VENTRICLE PLAX 2D LVIDd:         4.60 cm  Diastology LVIDs:         2.60 cm  LV e' medial:    15.70 cm/s LV PW:         1.10 cm  LV E/e' medial:  6.1 LV IVS:        1.00 cm  LV e' lateral:   14.50 cm/s LVOT diam:  2.30 cm  LV E/e' lateral: 6.6 LV SV:         120 LVOT Area:     4.15 cm  RIGHT VENTRICLE RV S prime:     17.50 cm/s TAPSE (M-mode): 2.7 cm LEFT ATRIUM             RIGHT ATRIUM LA diam:        2.50 cm RA Area:     13.00 cm LA Vol (A2C):   28.8 ml RA Volume:   28.40 ml LA Vol (A4C):   30.4 ml LA Biplane Vol: 30.6 ml  AORTIC VALVE LVOT Vmax:   132.00 cm/s LVOT Vmean:  102.000 cm/s LVOT VTI:    0.290 m  AORTA Ao Root diam: 3.70 cm Ao Asc diam:  3.00 cm MITRAL VALVE MV Area (PHT): 3.37 cm    SHUNTS MV Decel Time: 225 msec    Systemic VTI:  0.29 m MV E velocity: 95.10 cm/s  Systemic Diam: 2.30 cm MV A velocity: 84.90 cm/s MV E/A ratio:  1.12 Mihai Croitoru MD Electronically signed by Thurmon Fair MD Signature Date/Time: 07/05/2020/4:53:37 PM    Final       Scheduled Meds: . folic acid  1 mg Oral Daily  . LORazepam  0-4 mg Intravenous Q12H  . multivitamin with minerals  1 tablet Oral Daily  . nicotine  21 mg Transdermal Daily  . thiamine  100 mg Oral Daily   Or  .  thiamine  100 mg Intravenous Daily   Continuous Infusions: . sodium chloride 125 mL/hr at 07/06/20 0615  .  ceFAZolin (ANCEF) IV 2 g (07/06/20 1156)     LOS: 2 days      Calvert Cantor, MD [facility] Pager: [email or web address] 07/06/2020, 4:58 PM

## 2020-07-06 NOTE — Progress Notes (Signed)
Patient given 2 cups grape juice with 3 packs of sugar for CBG of 69, will re-check CBG

## 2020-07-07 DIAGNOSIS — G9340 Encephalopathy, unspecified: Secondary | ICD-10-CM | POA: Diagnosis not present

## 2020-07-07 LAB — GLUCOSE, CAPILLARY
Glucose-Capillary: 101 mg/dL — ABNORMAL HIGH (ref 70–99)
Glucose-Capillary: 132 mg/dL — ABNORMAL HIGH (ref 70–99)
Glucose-Capillary: 82 mg/dL (ref 70–99)
Glucose-Capillary: 94 mg/dL (ref 70–99)

## 2020-07-07 LAB — CBC
HCT: 27.8 % — ABNORMAL LOW (ref 39.0–52.0)
Hemoglobin: 9.1 g/dL — ABNORMAL LOW (ref 13.0–17.0)
MCH: 29.7 pg (ref 26.0–34.0)
MCHC: 32.7 g/dL (ref 30.0–36.0)
MCV: 90.8 fL (ref 80.0–100.0)
Platelets: 115 10*3/uL — ABNORMAL LOW (ref 150–400)
RBC: 3.06 MIL/uL — ABNORMAL LOW (ref 4.22–5.81)
RDW: 14 % (ref 11.5–15.5)
WBC: 9.8 10*3/uL (ref 4.0–10.5)
nRBC: 0 % (ref 0.0–0.2)

## 2020-07-07 LAB — CK
Total CK: 6094 U/L — ABNORMAL HIGH (ref 49–397)
Total CK: 4884 U/L — ABNORMAL HIGH (ref 49–397)

## 2020-07-07 LAB — COMPREHENSIVE METABOLIC PANEL
ALT: 173 U/L — ABNORMAL HIGH (ref 0–44)
AST: 398 U/L — ABNORMAL HIGH (ref 15–41)
Albumin: 2.7 g/dL — ABNORMAL LOW (ref 3.5–5.0)
Alkaline Phosphatase: 62 U/L (ref 38–126)
Anion gap: 9 (ref 5–15)
BUN: 37 mg/dL — ABNORMAL HIGH (ref 6–20)
CO2: 22 mmol/L (ref 22–32)
Calcium: 8 mg/dL — ABNORMAL LOW (ref 8.9–10.3)
Chloride: 110 mmol/L (ref 98–111)
Creatinine, Ser: 1.92 mg/dL — ABNORMAL HIGH (ref 0.61–1.24)
GFR, Estimated: 47 mL/min — ABNORMAL LOW (ref >60)
Glucose, Bld: 96 mg/dL (ref 70–99)
Potassium: 3.4 mmol/L — ABNORMAL LOW (ref 3.5–5.1)
Sodium: 141 mmol/L (ref 135–145)
Total Bilirubin: 1.1 mg/dL (ref 0.3–1.2)
Total Protein: 5.1 g/dL — ABNORMAL LOW (ref 6.5–8.1)

## 2020-07-07 MED ORDER — POTASSIUM CHLORIDE CRYS ER 20 MEQ PO TBCR
40.0000 meq | EXTENDED_RELEASE_TABLET | Freq: Once | ORAL | Status: AC
  Administered 2020-07-07: 40 meq via ORAL
  Filled 2020-07-07: qty 2

## 2020-07-07 MED ORDER — CEFAZOLIN SODIUM-DEXTROSE 2-4 GM/100ML-% IV SOLN
2.0000 g | Freq: Three times a day (TID) | INTRAVENOUS | Status: DC
  Administered 2020-07-07 – 2020-07-12 (×13): 2 g via INTRAVENOUS
  Filled 2020-07-07 (×16): qty 100

## 2020-07-07 MED ORDER — SODIUM CHLORIDE 0.9 % IV SOLN: INTRAVENOUS | Status: DC | PRN

## 2020-07-07 NOTE — Progress Notes (Signed)
[facility] ROUNDING NOTE   Subjective:   Interval History: This is a 30 year old male with a history of polysubstance abuse including alcohol heroin and cocaine.  He also has a history of hepatitis C he presents to the emergency room in agitated state in the setting of recent IV drug use.  He was found to have acute kidney injury rhabdomyolysis and staph aureus and Streptococcus bacteremia.  His CK peaked at 43,000 on 07/05/2020 is improving.  His creatinine peaked at 4.14 mg/dL 5/28 4132.  Urine does reveal some RBCs per high-power field 1 WBC 6-10 per high-power field.  Serologies have been sent although working diagnosis of rhabdomyolysis seems likely.  Blood pressure 141/85 pulse 105 temperature 99.2 O2 sats 98% room  Urine output 2.5 L 07/06/2020  Renal panel pending for 07/07/2020 hemoglobin 9.1 platelets 115 last creatinine seem to be improving to 3.71   Objective:  Vital signs in last 24 hours:  Temp:  [97.5 F (36.4 C)-99.2 F (37.3 C)] 99.2 F (37.3 C) (02/17 0807) Pulse Rate:  [87-109] 104 (02/17 0807) Resp:  [18-19] 18 (02/17 0807) BP: (135-149)/(80-90) 141/85 (02/17 0807) SpO2:  [98 %-100 %] 98 % (02/17 0807)  Weight change:  Filed Weights   07/05/20 0800 07/06/20 0600  Weight: 95 kg 96.3 kg    Intake/Output: I/O last 3 completed shifts: In: 4579.7 [P.O.:1440; I.V.:3139.7] Out: 4600 [Urine:4600]   Intake/Output this shift:  Total I/O In: 850 [P.O.:850] Out: -   GEN: lying in bed, in restraints, groggy HEENT sclerae anicteric and has dry, cracked lips NECK no overt JVD PULM clear bilaterally CV RRR II/VI murmur LLSB and LUSB ABD soft, nontender EXT 1+ edema of bilateral upper and lower extremities NEURO groggy, slurred speech but answers appropriately SKIN: some blisters on hands and feet   Basic Metabolic Panel: Recent Labs  Lab 07/04/20 0755 07/04/20 1458 07/05/20 0412 07/05/20 0952 07/05/20 1854 07/06/20 0421  NA 133*  --  133*  136 140 143  K 4.8  --  4.5 4.7 4.3 4.0  CL 95*  --  101 105 108 112*  CO2 19*  --  18* 17* 20* 20*  GLUCOSE 90  --  94 92 90 95  BUN 73*  --  91* 89* 85* 75*  CREATININE 3.37*  --  4.14* 4.15* 3.90* 3.71*  CALCIUM 7.8*  --  7.2* 7.3* 7.5* 7.9*  MG  --  2.8*  --   --   --   --   PHOS  --  6.9*  --   --   --   --     Liver Function Tests: Recent Labs  Lab 07/04/20 0113 07/04/20 0251 07/04/20 1458 07/05/20 0412  AST 802* 928* 1,085* 1,100*  ALT 359* 384* 443* 525*  ALKPHOS 82 83 63 69  BILITOT 2.5* 3.0* 1.6* 1.3*  PROT 7.7 7.8 6.3* 5.6*  ALBUMIN 4.8 4.7 3.7 3.2*   No results for input(s): LIPASE, AMYLASE in the last 168 hours. No results for input(s): AMMONIA in the last 168 hours.  CBC: Recent Labs  Lab 07/04/20 0113 07/04/20 0251 07/05/20 0412 07/07/20 0805  WBC 25.5* 24.5* 15.4* 9.8  NEUTROABS 22.5* 20.7* 13.5*  --   HGB 14.3 14.5 11.8* 9.1*  HCT 40.1 40.7 34.0* 27.8*  MCV 86.1 86.8 89.9 90.8  PLT 203 195 122* 115*    Cardiac Enzymes: Recent Labs  Lab 07/04/20 0251 07/05/20 0412 07/06/20 0421 07/07/20 0352 07/07/20 0805  CKTOTAL 44,010* 27,253* 66,440*  6,094* 4,884*    BNP: Invalid input(s): POCBNP  CBG: Recent Labs  Lab 07/06/20 1144 07/06/20 1733 07/06/20 1749 07/06/20 2345 07/07/20 0602  GLUCAP 78 69* 91 101* 94    Microbiology: Results for orders placed or performed during the hospital encounter of 07/04/20  Resp Panel by RT-PCR (Flu A&B, Covid) Nasopharyngeal Swab     Status: None   Collection Time: 07/04/20  3:08 AM   Specimen: Nasopharyngeal Swab; Nasopharyngeal(NP) swabs in vial transport medium  Result Value Ref Range Status   SARS Coronavirus 2 by RT PCR NEGATIVE NEGATIVE Final    Comment: (NOTE) SARS-CoV-2 target nucleic acids are NOT DETECTED.  The SARS-CoV-2 RNA is generally detectable in upper respiratory specimens during the acute phase of infection. The lowest concentration of SARS-CoV-2 viral copies this assay can  detect is 138 copies/mL. A negative result does not preclude SARS-Cov-2 infection and should not be used as the sole basis for treatment or other patient management decisions. A negative result may occur with  improper specimen collection/handling, submission of specimen other than nasopharyngeal swab, presence of viral mutation(s) within the areas targeted by this assay, and inadequate number of viral copies(<138 copies/mL). A negative result must be combined with clinical observations, patient history, and epidemiological information. The expected result is Negative.  Fact Sheet for Patients:  BloggerCourse.com  Fact Sheet for Healthcare Providers:  SeriousBroker.it  This test is no t yet approved or cleared by the [place] FDA and  has been authorized for detection and/or diagnosis of SARS-CoV-2 by FDA under an Emergency Use Authorization (EUA). This EUA will remain  in effect (meaning this test can be used) for the duration of the COVID-19 declaration under Section 564(b)(1) of the Act, 21 U.S.C.section 360bbb-3(b)(1), unless the authorization is terminated  or revoked sooner.       Influenza A by PCR NEGATIVE NEGATIVE Final   Influenza B by PCR NEGATIVE NEGATIVE Final    Comment: (NOTE) The Xpert Xpress SARS-CoV-2/FLU/RSV plus assay is intended as an aid in the diagnosis of influenza from Nasopharyngeal swab specimens and should not be used as a sole basis for treatment. Nasal washings and aspirates are unacceptable for Xpert Xpress SARS-CoV-2/FLU/RSV testing.  Fact Sheet for Patients: BloggerCourse.com  Fact Sheet for Healthcare Providers: SeriousBroker.it  This test is not yet approved or cleared by the [place] FDA and has been authorized for detection and/or diagnosis of SARS-CoV-2 by FDA under an Emergency Use Authorization (EUA). This EUA will remain in  effect (meaning this test can be used) for the duration of the COVID-19 declaration under Section 564(b)(1) of the Act, 21 U.S.C. section 360bbb-3(b)(1), unless the authorization is terminated or revoked.  Performed at [facility], [address], [place], [place] [address]   Culture, blood (routine x 2)     Status: Abnormal (Preliminary result)   Collection Time: 07/04/20  6:13 AM   Specimen: BLOOD  Result Value Ref Range Status   Specimen Description BLOOD RIGHT ANTECUBITAL  Final   Special Requests   Final    BOTTLES DRAWN AEROBIC AND ANAEROBIC Blood Culture adequate volume   Culture  Setup Time   Final    GRAM POSITIVE COCCI IN CLUSTERS IN BOTH AEROBIC AND ANAEROBIC BOTTLES CRITICAL RESULT CALLED TO, READ BACK BY AND VERIFIED WITH: J LEDFORD PHARMD 07/04/20 2316 JDW    Culture (A)  Final    STAPHYLOCOCCUS AUREUS CULTURE REINCUBATED FOR BETTER GROWTH Performed at [facility], [address], [place], [place] [address]    Report Status PENDING  Incomplete   Organism ID, Bacteria STAPHYLOCOCCUS AUREUS  Final      Susceptibility   Staphylococcus aureus - MIC*    CIPROFLOXACIN <=0.5 SENSITIVE Sensitive     ERYTHROMYCIN >=8 RESISTANT Resistant     GENTAMICIN <=0.5 SENSITIVE Sensitive     OXACILLIN <=0.25 SENSITIVE Sensitive     TETRACYCLINE <=1 SENSITIVE Sensitive     VANCOMYCIN <=0.5 SENSITIVE Sensitive     TRIMETH/SULFA <=10 SENSITIVE Sensitive     CLINDAMYCIN RESISTANT Resistant     RIFAMPIN <=0.5 SENSITIVE Sensitive     Inducible Clindamycin POSITIVE Resistant     * STAPHYLOCOCCUS AUREUS  Blood Culture ID Panel (Reflexed)     Status: Abnormal   Collection Time: 07/04/20  6:13 AM  Result Value Ref Range Status   Enterococcus faecalis NOT DETECTED NOT DETECTED Final   Enterococcus Faecium NOT DETECTED NOT DETECTED Final   Listeria monocytogenes NOT DETECTED NOT DETECTED Final   Staphylococcus species DETECTED (A) NOT DETECTED Final    Comment: CRITICAL  RESULT CALLED TO, READ BACK BY AND VERIFIED WITH: J LEDFORD PHARMD 07/04/20 2316 JDW    Staphylococcus aureus (BCID) DETECTED (A) NOT DETECTED Final    Comment: Methicillin (oxacillin) susceptible Staphylococcus aureus (MSSA). Preferred therapy is anti staphylococcal beta lactam antibiotic (Cefazolin or Nafcillin), unless clinically contraindicated. CRITICAL RESULT CALLED TO, READ BACK BY AND VERIFIED WITH: J LEDFORD [facility] 07/04/20 2316 JDW    Staphylococcus epidermidis DETECTED (A) NOT DETECTED Final    Comment: CRITICAL RESULT CALLED TO, READ BACK BY AND VERIFIED WITH: J LEDFORD PHARMD 07/04/20 2316 JDW    Staphylococcus lugdunensis NOT DETECTED NOT DETECTED Final   Streptococcus species DETECTED (A) NOT DETECTED Final    Comment: Not Enterococcus species, Streptococcus agalactiae, Streptococcus pyogenes, or Streptococcus pneumoniae. CRITICAL RESULT CALLED TO, READ BACK BY AND VERIFIED WITH: J LEDFORD PHARMD 07/04/20 2316 JDW    Streptococcus agalactiae NOT DETECTED NOT DETECTED Final   Streptococcus pneumoniae NOT DETECTED NOT DETECTED Final   Streptococcus pyogenes NOT DETECTED NOT DETECTED Final   A.calcoaceticus-baumannii NOT DETECTED NOT DETECTED Final   Bacteroides fragilis NOT DETECTED NOT DETECTED Final   Enterobacterales NOT DETECTED NOT DETECTED Final   Enterobacter cloacae complex NOT DETECTED NOT DETECTED Final   Escherichia coli NOT DETECTED NOT DETECTED Final   Klebsiella aerogenes NOT DETECTED NOT DETECTED Final   Klebsiella oxytoca NOT DETECTED NOT DETECTED Final   Klebsiella pneumoniae NOT DETECTED NOT DETECTED Final   Proteus species NOT DETECTED NOT DETECTED Final   Salmonella species NOT DETECTED NOT DETECTED Final   Serratia marcescens NOT DETECTED NOT DETECTED Final   Haemophilus influenzae NOT DETECTED NOT DETECTED Final   Neisseria meningitidis NOT DETECTED NOT DETECTED Final   Pseudomonas aeruginosa NOT DETECTED NOT DETECTED Final   Stenotrophomonas  maltophilia NOT DETECTED NOT DETECTED Final   Candida albicans NOT DETECTED NOT DETECTED Final   Candida auris NOT DETECTED NOT DETECTED Final   Candida glabrata NOT DETECTED NOT DETECTED Final   Candida krusei NOT DETECTED NOT DETECTED Final   Candida parapsilosis NOT DETECTED NOT DETECTED Final   Candida tropicalis NOT DETECTED NOT DETECTED Final   Cryptococcus neoformans/gattii NOT DETECTED NOT DETECTED Final   Methicillin resistance mecA/C NOT DETECTED NOT DETECTED Final   Meth resistant mecA/C and MREJ NOT DETECTED NOT DETECTED Final    Comment: Performed at [facility], [address] [place], [place] [address]  Culture, blood (routine  x 2)     Status: None (Preliminary result)   Collection Time: 07/04/20  7:55 AM   Specimen: BLOOD RIGHT HAND  Result Value Ref Range Status   Specimen Description BLOOD RIGHT HAND  Final   Special Requests   Final    BOTTLES DRAWN AEROBIC AND ANAEROBIC Blood Culture adequate volume   Culture   Final    NO GROWTH 3 DAYS Performed at [facility], [address] [place], [place] [address]    Report Status PENDING  Incomplete  Culture, blood (routine x 2)     Status: None (Preliminary result)   Collection Time: 07/06/20  4:26 AM   Specimen: BLOOD  Result Value Ref Range Status   Specimen Description BLOOD RIGHT ANTECUBITAL  Final   Special Requests AEROBIC BOTTLE ONLY Blood Culture adequate volume  Final   Culture   Final    NO GROWTH 1 DAY Performed at [facility], [address] [place], [place] [address]    Report Status PENDING  Incomplete  Culture, blood (routine x 2)     Status: None (Preliminary result)   Collection Time: 07/06/20  4:29 AM   Specimen: BLOOD  Result Value Ref Range Status   Specimen Description BLOOD LEFT ANTECUBITAL  Final   Special Requests AEROBIC BOTTLE ONLY Blood Culture adequate volume  Final   Culture   Final    NO GROWTH 1 DAY Performed at [facility], [address]  [place], [place] [address]    Report Status PENDING  Incomplete    Coagulation Studies: Recent Labs    07/05/20 0412  LABPROT 15.6*  INR 1.3*    Urinalysis: Recent Labs    07/05/20 1400  COLORURINE YELLOW  LABSPEC 1.010  PHURINE 5.0  GLUCOSEU NEGATIVE  HGBUR LARGE*  BILIRUBINUR NEGATIVE  KETONESUR 5*  PROTEINUR 30*  NITRITE NEGATIVE  LEUKOCYTESUR NEGATIVE      Imaging: CT HEAD WO CONTRAST  Result Date: 07/06/2020 CLINICAL DATA:  Mental status changes of unknown cause EXAM: CT HEAD WITHOUT CONTRAST TECHNIQUE: Contiguous axial images were obtained from the base of the skull through the vertex without intravenous contrast. Sagittal and coronal MPR images reconstructed from axial data set. COMPARISON:  None FINDINGS: Brain: Normal ventricular morphology. No midline shift or mass effect. Normal appearance of brain parenchyma. No intracranial hemorrhage, mass lesion, or evidence of acute infarction. No extra-axial fluid collections. Vascular: No hyperdense vessels Skull: Intact Sinuses/Orbits: Clear Other: N/A IMPRESSION: Normal exam. Electronically Signed   By: Ulyses Southward M.D.   On: 07/06/2020 15:00   ECHOCARDIOGRAM COMPLETE  Result Date: 07/05/2020    ECHOCARDIOGRAM REPORT   Patient Name:   Zachary Hurst Date of Exam: 07/05/2020 Medical Rec #:  [id number]        Height: Accession #:    [id number]       Weight: Date of Birth:  [birth year]        BSA: Patient Age:    30 years         BP:           139/81 mmHg Patient Gender: M                HR:           115 bpm. Exam Location:  Inpatient Procedure: 2D Echo, Cardiac Doppler and Color Doppler Indications:    Bacteremia R78.81  History:        Patient has no prior history of  Echocardiogram examinations.  Sonographer:    Eulah PontSarah Pirrotta RDCS Referring Phys: [id number] [facility] CenterABINA MANANDHAR IMPRESSIONS  1. Left ventricular ejection fraction, by estimation, is 60 to 65%. The left ventricle has normal function. The left ventricle has no regional wall  motion abnormalities. Left ventricular diastolic parameters were normal.  2. Right ventricular systolic function is normal. The right ventricular size is normal.  3. The mitral valve is normal in structure. No evidence of mitral valve regurgitation. No evidence of mitral stenosis.  4. The aortic valve is normal in structure. Aortic valve regurgitation is not visualized. No aortic stenosis is present.  5. The inferior vena cava is normal in size with greater than 50% respiratory variability, suggesting right atrial pressure of 3 mmHg. Conclusion(s)/Recommendation(s): No evidence of valvular vegetations on this transthoracic echocardiogram. Would recommend a transesophageal echocardiogram to exclude infective endocarditis if clinically indicated. FINDINGS  Left Ventricle: Left ventricular ejection fraction, by estimation, is 60 to 65%. The left ventricle has normal function. The left ventricle has no regional wall motion abnormalities. The left ventricular internal cavity size was normal in size. There is  no left ventricular hypertrophy. Left ventricular diastolic parameters were normal. Normal left ventricular filling pressure. Right Ventricle: The right ventricular size is normal. No increase in right ventricular wall thickness. Right ventricular systolic function is normal. Left Atrium: Left atrial size was normal in size. Right Atrium: Right atrial size was normal in size. Pericardium: There is no evidence of pericardial effusion. Mitral Valve: The mitral valve is normal in structure. No evidence of mitral valve regurgitation. No evidence of mitral valve stenosis. Tricuspid Valve: The tricuspid valve is normal in structure. Tricuspid valve regurgitation is not demonstrated. No evidence of tricuspid stenosis. Aortic Valve: The aortic valve is normal in structure. Aortic valve regurgitation is not visualized. No aortic stenosis is present. Pulmonic Valve: The pulmonic valve was normal in structure. Pulmonic valve  regurgitation is not visualized. No evidence of pulmonic stenosis. Aorta: The aortic root is normal in size and structure. Venous: The inferior vena cava is normal in size with greater than 50% respiratory variability, suggesting right atrial pressure of 3 mmHg. IAS/Shunts: No atrial level shunt detected by color flow Doppler.  LEFT VENTRICLE PLAX 2D LVIDd:         4.60 cm  Diastology LVIDs:         2.60 cm  LV e' medial:    15.70 cm/s LV PW:         1.10 cm  LV E/e' medial:  6.1 LV IVS:        1.00 cm  LV e' lateral:   14.50 cm/s LVOT diam:     2.30 cm  LV E/e' lateral: 6.6 LV SV:         120 LVOT Area:     4.15 cm  RIGHT VENTRICLE RV S prime:     17.50 cm/s TAPSE (M-mode): 2.7 cm LEFT ATRIUM             RIGHT ATRIUM LA diam:        2.50 cm RA Area:     13.00 cm LA Vol (A2C):   28.8 ml RA Volume:   28.40 ml LA Vol (A4C):   30.4 ml LA Biplane Vol: 30.6 ml  AORTIC VALVE LVOT Vmax:   132.00 cm/s LVOT Vmean:  102.000 cm/s LVOT VTI:    0.290 m  AORTA Ao Root diam: 3.70 cm Ao Asc diam:  3.00 cm MITRAL VALVE MV Area (PHT): 3.37  cm    SHUNTS MV Decel Time: 225 msec    Systemic VTI:  0.29 m MV E velocity: 95.10 cm/s  Systemic Diam: 2.30 cm MV A velocity: 84.90 cm/s MV E/A ratio:  1.12 Mihai Croitoru MD Electronically signed by Thurmon Fair MD Signature Date/Time: 07/05/2020/4:53:37 PM    Final      Medications:   .  ceFAZolin (ANCEF) IV 2 g (07/07/20 0851)   . folic acid  1 mg Oral Daily  . multivitamin with minerals  1 tablet Oral Daily  . nicotine  21 mg Transdermal Daily  . thiamine  100 mg Oral Daily   Or  . thiamine  100 mg Intravenous Daily   labetalol, lip balm  Assessment/ Plan:  1.  AKI (nonoliguric): baseline creatinine is 1.03 as of October 2021.  CT abd/ pelvis 07/04/20 without obstruction.  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  In the setting of bacteremia with staph and strep, syn-infectious GN is also a concern- although the treatment is treating the underlying  infection.  A biopsy wouldn't be necessary because it wouldn't change the need for IV antibiotic therapy.  Will send some complements, ANA, and ANCA.  Agree with IVF resuscitation.  Cr is already coming down and CK is dramatically down to 15,000 today so I anticipate that he'll need IVFs maybe 24-48 more hours.  His UOP is robust.  No need for dialysis at present.   2.  Rhabdomyolysis: getting NS @ 125/ hr.  CK 43000--> 15000 today.    3.  MSSA and strep bacteremia: on Ancef, TTE without vegetations, plan for TEE  4.  EtOH abuse/ withdrawal: on CIWA protocol.    5.  Polysubstance abuse: was in rehab and got a weekend pass when he had this most recent relapse.      LOS: 3 Garnetta Buddy  :44 AM

## 2020-07-07 NOTE — Progress Notes (Addendum)
PHARMACY NOTE:  ANTIMICROBIAL RENAL DOSAGE ADJUSTMENT  Current antimicrobial regimen includes a mismatch between antimicrobial dosage and estimated renal function.  As per policy approved by the Pharmacy & Therapeutics and Medical Executive Committees, the antimicrobial dosage will be adjusted accordingly.  Current antimicrobial dosage: Cefazolin 2 gm IV q12 hr Indication: MSSA Bacteremia  Renal Function:  SCr decreased to 1.92. estimated CrCl =47 ml/min  CrCl cannot be calculated (Unknown ideal weight.). []      On intermittent HD, scheduled: []      On CRRT    Antimicrobial dosage has been changed to: increased Cefazolin dose to 2 g IV q8hr.    Thank you for allowing pharmacy to be a part of this patient's care.  , RPh Clinical Pharmacist Please check AMION for all [facility] Pharmacy phone numbers After 10:00 PM, call Main Pharmacy [phone]   07/07/2020 4:21 PM

## 2020-07-07 NOTE — Progress Notes (Addendum)
[facility] Infectious Diseases Follow Up Note  Patient Identification: Patient Name: Zachary Hurst MRN: [id number] Admit Date: 07/04/2020 12:06 AM Age: 30 y.o.Today's Date: 07/07/2020   Reason for Visit: Bacteremia   Principal Problem:   Acute encephalopathy Active Problems:   Acute delirium   Elevated LFTs   ARF (acute renal failure) (HCC)   Hepatitis C antibody positive in blood  Antibiotics : cefazolin 2/14-  Lines/Tubes - PIVs   Interval Events: mental status is slowly improving, afebrile, no leukocytosis, TTE with no vegetations   Assessment MSSA bacteremia  Thrombocytopenia - in the setting of sepsis  AKI/rhabdomyolysis/transaminitis - improving with IVF, Nephro on board  Withdrawals IVDU HCB ab reactive - HCV RNA not detected   Recommendations Continue cefazolin as is Fu repeat blood cx on 2/16 for clearance  Will need a TEE to r/o endocarditis when able Withdrawal management per primary Monitor CBC and BMP   Rest of the management as per the primary team. Thank you for the consult. Please page with pertinent questions or concerns.  ______________________________________________________________________ Subjective patient seen and examined at the bedside. He is still 4 point restrained, answers appropriately to questions  Vitals BP (!) 146/87 (BP Location: Right Arm)   Pulse (!) 104   Temp 99.5 F (37.5 C) (Oral)   Resp 18   Wt 96.3 kg   SpO2 96%     Physical Exam 4 point restrained and is drowsy but answers questions appropriately HEENT - dry mucosa  Chest - clear bilaterally CVS- Normal s1s2 RRR Abdomen - soft Extremities - dry scabs   Pertinent Microbiology - Results for orders placed or performed during the hospital encounter of 07/04/20  Resp Panel by RT-PCR (Flu A&B, Covid) Nasopharyngeal Swab     Status: None   Collection Time: 07/04/20  3:08 AM   Specimen: Nasopharyngeal Swab;  Nasopharyngeal(NP) swabs in vial transport medium  Result Value Ref Range Status   SARS Coronavirus 2 by RT PCR NEGATIVE NEGATIVE Final    Comment: (NOTE) SARS-CoV-2 target nucleic acids are NOT DETECTED.  The SARS-CoV-2 RNA is generally detectable in upper respiratory specimens during the acute phase of infection. The lowest concentration of SARS-CoV-2 viral copies this assay can detect is 138 copies/mL. A negative result does not preclude SARS-Cov-2 infection and should not be used as the sole basis for treatment or other patient management decisions. A negative result may occur with  improper specimen collection/handling, submission of specimen other than nasopharyngeal swab, presence of viral mutation(s) within the areas targeted by this assay, and inadequate number of viral copies(<138 copies/mL). A negative result must be combined with clinical observations, patient history, and epidemiological information. The expected result is Negative.  Fact Sheet for Patients:  BloggerCourse.com  Fact Sheet for Healthcare Providers:  SeriousBroker.it  This test is no t yet approved or cleared by the [place] FDA and  has been authorized for detection and/or diagnosis of SARS-CoV-2 by FDA under an Emergency Use Authorization (EUA). This EUA will remain  in effect (meaning this test can be used) for the duration of the COVID-19 declaration under Section 564(b)(1) of the Act, 21 U.S.C.section 360bbb-3(b)(1), unless the authorization is terminated  or revoked sooner.       Influenza A by PCR NEGATIVE NEGATIVE Final   Influenza B by PCR NEGATIVE NEGATIVE Final    Comment: (NOTE) The Xpert Xpress SARS-CoV-2/FLU/RSV plus assay is intended as an aid in the diagnosis of influenza from Nasopharyngeal swab specimens and should not be used  as a sole basis for treatment. Nasal washings and aspirates are unacceptable for Xpert Xpress  SARS-CoV-2/FLU/RSV testing.  Fact Sheet for Patients: BloggerCourse.comhttps://www.fda.gov/media/152166/download  Fact Sheet for Healthcare Providers: SeriousBroker.ithttps://www.fda.gov/media/152162/download  This test is not yet approved or cleared by the [place] FDA and has been authorized for detection and/or diagnosis of SARS-CoV-2 by FDA under an Emergency Use Authorization (EUA). This EUA will remain in effect (meaning this test can be used) for the duration of the COVID-19 declaration under Section 564(b)(1) of the Act, 21 U.S.C. section 360bbb-3(b)(1), unless the authorization is terminated or revoked.  Performed at [facility], [address] [place], [place] [address]   Culture, blood (routine x 2)     Status: Abnormal (Preliminary result)   Collection Time: 07/04/20  6:13 AM   Specimen: BLOOD  Result Value Ref Range Status   Specimen Description BLOOD RIGHT ANTECUBITAL  Final   Special Requests   Final    BOTTLES DRAWN AEROBIC AND ANAEROBIC Blood Culture adequate volume   Culture  Setup Time   Final    GRAM POSITIVE COCCI IN CLUSTERS IN BOTH AEROBIC AND ANAEROBIC BOTTLES CRITICAL RESULT CALLED TO, READ BACK BY AND VERIFIED WITH: J LEDFORD PHARMD 07/04/20 2316 JDW    Culture (A)  Final    STAPHYLOCOCCUS AUREUS CULTURE REINCUBATED FOR BETTER GROWTH Performed at [facility], [address] [place], [place] [address]    Report Status PENDING  Incomplete   Organism ID, Bacteria STAPHYLOCOCCUS AUREUS  Final      Susceptibility   Staphylococcus aureus - MIC*    CIPROFLOXACIN <=0.5 SENSITIVE Sensitive     ERYTHROMYCIN >=8 RESISTANT Resistant     GENTAMICIN <=0.5 SENSITIVE Sensitive     OXACILLIN <=0.25 SENSITIVE Sensitive     TETRACYCLINE <=1 SENSITIVE Sensitive     VANCOMYCIN <=0.5 SENSITIVE Sensitive     TRIMETH/SULFA <=10 SENSITIVE Sensitive     CLINDAMYCIN RESISTANT Resistant     RIFAMPIN <=0.5 SENSITIVE Sensitive     Inducible Clindamycin POSITIVE Resistant     *  STAPHYLOCOCCUS AUREUS  Blood Culture ID Panel (Reflexed)     Status: Abnormal   Collection Time: 07/04/20  6:13 AM  Result Value Ref Range Status   Enterococcus faecalis NOT DETECTED NOT DETECTED Final   Enterococcus Faecium NOT DETECTED NOT DETECTED Final   Listeria monocytogenes NOT DETECTED NOT DETECTED Final   Staphylococcus species DETECTED (A) NOT DETECTED Final    Comment: CRITICAL RESULT CALLED TO, READ BACK BY AND VERIFIED WITH: J LEDFORD PHARMD 07/04/20 2316 JDW    Staphylococcus aureus (BCID) DETECTED (A) NOT DETECTED Final    Comment: Methicillin (oxacillin) susceptible Staphylococcus aureus (MSSA). Preferred therapy is anti staphylococcal beta lactam antibiotic (Cefazolin or Nafcillin), unless clinically contraindicated. CRITICAL RESULT CALLED TO, READ BACK BY AND VERIFIED WITH: J LEDFORD [facility] 07/04/20 2316 JDW    Staphylococcus epidermidis DETECTED (A) NOT DETECTED Final    Comment: CRITICAL RESULT CALLED TO, READ BACK BY AND VERIFIED WITH: J LEDFORD PHARMD 07/04/20 2316 JDW    Staphylococcus lugdunensis NOT DETECTED NOT DETECTED Final   Streptococcus species DETECTED (A) NOT DETECTED Final    Comment: Not Enterococcus species, Streptococcus agalactiae, Streptococcus pyogenes, or Streptococcus pneumoniae. CRITICAL RESULT CALLED TO, READ BACK BY AND VERIFIED WITH: J LEDFORD PHARMD 07/04/20 2316 JDW    Streptococcus agalactiae NOT DETECTED NOT DETECTED Final   Streptococcus pneumoniae NOT DETECTED NOT DETECTED Final   Streptococcus pyogenes NOT DETECTED NOT DETECTED Final  A.calcoaceticus-baumannii NOT DETECTED NOT DETECTED Final   Bacteroides fragilis NOT DETECTED NOT DETECTED Final   Enterobacterales NOT DETECTED NOT DETECTED Final   Enterobacter cloacae complex NOT DETECTED NOT DETECTED Final   Escherichia coli NOT DETECTED NOT DETECTED Final   Klebsiella aerogenes NOT DETECTED NOT DETECTED Final   Klebsiella oxytoca NOT DETECTED NOT DETECTED Final   Klebsiella  pneumoniae NOT DETECTED NOT DETECTED Final   Proteus species NOT DETECTED NOT DETECTED Final   Salmonella species NOT DETECTED NOT DETECTED Final   Serratia marcescens NOT DETECTED NOT DETECTED Final   Haemophilus influenzae NOT DETECTED NOT DETECTED Final   Neisseria meningitidis NOT DETECTED NOT DETECTED Final   Pseudomonas aeruginosa NOT DETECTED NOT DETECTED Final   Stenotrophomonas maltophilia NOT DETECTED NOT DETECTED Final   Candida albicans NOT DETECTED NOT DETECTED Final   Candida auris NOT DETECTED NOT DETECTED Final   Candida glabrata NOT DETECTED NOT DETECTED Final   Candida krusei NOT DETECTED NOT DETECTED Final   Candida parapsilosis NOT DETECTED NOT DETECTED Final   Candida tropicalis NOT DETECTED NOT DETECTED Final   Cryptococcus neoformans/gattii NOT DETECTED NOT DETECTED Final   Methicillin resistance mecA/C NOT DETECTED NOT DETECTED Final   Meth resistant mecA/C and MREJ NOT DETECTED NOT DETECTED Final    Comment: Performed at [facility], [address], [place], [place] [address]  Culture, blood (routine x 2)     Status: None (Preliminary result)   Collection Time: 07/04/20  7:55 AM   Specimen: BLOOD RIGHT HAND  Result Value Ref Range Status   Specimen Description BLOOD RIGHT HAND  Final   Special Requests   Final    BOTTLES DRAWN AEROBIC AND ANAEROBIC Blood Culture adequate volume   Culture   Final    NO GROWTH 3 DAYS Performed at [facility], [address], [place], [place] [address]    Report Status PENDING  Incomplete  Culture, blood (routine x 2)     Status: None (Preliminary result)   Collection Time: 07/06/20  4:26 AM   Specimen: BLOOD  Result Value Ref Range Status   Specimen Description BLOOD RIGHT ANTECUBITAL  Final   Special Requests AEROBIC BOTTLE ONLY Blood Culture adequate volume  Final   Culture   Final    NO GROWTH 1 DAY Performed at [facility], [address], [place], [place] [address]    Report Status PENDING   Incomplete  Culture, blood (routine x 2)     Status: None (Preliminary result)   Collection Time: 07/06/20  4:29 AM   Specimen: BLOOD  Result Value Ref Range Status   Specimen Description BLOOD LEFT ANTECUBITAL  Final   Special Requests AEROBIC BOTTLE ONLY Blood Culture adequate volume  Final   Culture   Final    NO GROWTH 1 DAY Performed at [facility], [address] [place], [place] [address]    Report Status PENDING  Incomplete     Pertinent Lab. CBC Latest Ref Rng & Units 07/07/2020 07/05/2020 07/04/2020  WBC 4.0 - 10.5 K/uL 9.8 15.4(H) 24.5(H)  Hemoglobin 13.0 - 17.0 g/dL 6.6(Z) 11.8(L) 14.5  Hematocrit 39.0 - 52.0 % 27.8(L) 34.0(L) 40.7  Platelets 150 - 400 K/uL 115(L) 122(L) 195   CMP Latest Ref Rng & Units 07/07/2020 07/06/2020 07/05/2020  Glucose 70 - 99 mg/dL 96 95 90  BUN 6 - 20 mg/dL 99(J) 57(S) 17(B)  Creatinine 0.61 - 1.24 mg/dL 9.39(Q) 3.00(P) 2.33(A)  Sodium 135 -  145 mmol/L 141 143 140  Potassium 3.5 - 5.1 mmol/L 3.4(L) 4.0 4.3  Chloride 98 - 111 mmol/L 110 112(H) 108  CO2 22 - 32 mmol/L 22 20(L) 20(L)  Calcium 8.9 - 10.3 mg/dL 8.0(L) 7.9(L) 7.5(L)  Total Protein 6.5 - 8.1 g/dL 5.1(L) - -  Total Bilirubin 0.3 - 1.2 mg/dL 1.1 - -  Alkaline Phos 38 - 126 U/L 62 - -  AST 15 - 41 U/L 398(H) - -  ALT 0 - 44 U/L 173(H) - -     Pertinent Imaging today Plain films and CT images have been personally visualized and interpreted; radiology reports have been reviewed. Decision making incorporated into the Impression / Recommendations.  I have spent approx 30 minutes for this patient encounter including review of prior medical records with greater than 50% of time being face to face and coordination of their care.  Electronically signed by:   Odette Fraction, MD Infectious Disease Physician [facility] Pager: [phone]

## 2020-07-07 NOTE — H&P (View-Only) (Signed)
Attempted to consent patient for TEE. He states he just got back into town and is healing and will refuse TEE. I confirmed a second time that he refused TEE.    Zachary Hurst [name], PA-C 07/07/2020, 4:53 PM [phone] [facility] [address] [place], [place] [address]    

## 2020-07-07 NOTE — Progress Notes (Signed)
PROGRESS NOTE    Barbra SarksHunter Giannetti   ZOX:[id number]  DOB: 1990-10-20  DOA: 07/04/2020 PCP: Pcp, No   Brief Narrative:  Barbra SarksHunter Gardin is a 30 year old male with a history of heroin and cocaine abuse who was brought to the hospital by EMS with severe acute confusion his girlfriend called them.  According to the girlfriend the patient had been using heroin and cocaine that day and subsequently became extremely aggressive yelling and screaming and flailing his extremities. Further history obtained from the patient's mother.  She states that the patient has been in a drug rehab center for 2 months and was released on Friday on a pass for the weekend.  He visited his mother on Saturday who suspected that he may be high but did not notice any track marks at that time. The patient's mother further states that about 2 years ago the patient was admitted to [facility] after a similar episode of severe agitation when he used heroin.  She states that IV Ativan was ineffective in reducing his agitation and he was eventually placed in a medically induced coma.  He recuperated and was discharged within 6 days of being admitted.  He has been admitted to an inpatient rehab facility on 3 separate occasions but has relapsed each time.  Even Geodon in the ED for his severe agitation and this helped him.  Subjective: Patient is more coherent today than yesterday.  He states that he would like to start eating and drinking and get out of the restraints.  He has no other complaints.    Assessment & Plan:   Principal Problem:   Acute encephalopathy - multifactorial-he currently has bacteremia with staph and strep, AKI, and has used heroin which may have been mixed with fentanyl and other substances along with cocaine a few days ago -Much more oriented and directable today-I have discontinued Ativan and Haldol and we will see if we are able to keep him out of restraints-we will go ahead and order a sitter as he is  still quite impulsive and trying to get out of bed without assistance  Active Problems:  Staph and streptococcal bacteremia with leukocytosis -Likely related to IV drug abuse-he also snorts cocaine and may have some breakdown of the nasal mucosa-he also has numerous scabs on his feet and legs which may be entry points for bacteria -Continue cefazolin -2D echo reveals no vegetations-he will need a TEE -Appreciate ID consult  Drug abuse -With IV drugs and snorting drugs -Mother states that does not have any more money to pay for inpatient rehab facilities that she has paid for these facilities on 3 different occasions -He is a candidate for Suboxone or methadone and hopefully will be able to find a clinic for him to go to once he is closer to discharge  Rhabdomyolysis with AKI and metabolic acidosis -Suspect his AKI is related to rhabdomyolysis and some dehydration -His rhabdomyolysis may be related to the severe agitation and flailing of extremities noted by his girlfriend who sent a video of this to his mother-his mother feels that this is possibly related to fentanyl mixed in with the heroin that he was using -CK levels are improving but creatinine is been slow to improve and therefore I requested a nephrology consult yesterday -As CK has improved further today to 4884, hold off on IV fluids-continue to follow urine output and creatinine    Elevated LFTs -Likely related to sepsis and rhabdomyolysis related -his mom states that he does not drink  alcohol -AST 1085 and ALT 443-continue to follow    Hepatitis C antibody positive in blood  Thrombocytopenia - acute in the hospital- cont to follow- plt are 115 today   Time spent in minutes: 50-greater than 50% of the time taken in calling consults and speaking with mother DVT prophylaxis: SCDs Start: 07/04/20 0224  Code Status: Full code Family Communication: Mother Level of Care: Level of care: Progressive Disposition Plan:  Status  is: Inpatient  Remains inpatient appropriate because:IV treatments appropriate due to intensity of illness or inability to take PO   Dispo: The patient is from: Home              Anticipated d/c is to: Home              Anticipated d/c date is: > 3 days              Patient currently is not medically stable to d/c.   Difficult to place patient No  Consultants:   ID Procedures:   2D echo Antimicrobials:  Anti-infectives (From admission, onward)   Start     Dose/Rate Route Frequency Ordered Stop   07/04/20 2345  ceFAZolin (ANCEF) IVPB 2g/100 mL premix        2 g 200 mL/hr over 30 Minutes Intravenous Every 12 hours 07/04/20 2330     07/04/20 2330  ceFAZolin (ANCEF) IVPB 2g/100 mL premix  Status:  Discontinued        2 g 200 mL/hr over 30 Minutes Intravenous Every 8 hours 07/04/20 2328 07/04/20 2329       Objective: Vitals:   07/06/20 2015 07/06/20 2330 07/07/20 0410 07/07/20 0807  BP: 137/80 (!) 149/90 135/90 (!) 141/85  Pulse: 87 (!) 109 91 (!) 104  Resp: Temp: 98.4 F (36.9 C) (!) 97.5 F (36.4 C) 98.1 F (36.7 C) 99.2 F (37.3 C)  TempSrc: Oral Oral Oral Oral  SpO2: 100% 99% 99% 98%  Weight:        Intake/Output Summary (Last 24 hours) at 07/07/2020 1139 Last data filed at 07/07/2020 0900 Gross per 24 hour  Intake 2290 ml  Output 2450 ml  Net -160 ml   Filed Weights   07/05/20 0800 07/06/20 0600  Weight: 95 kg 96.3 kg    Examination: General exam: Appears comfortable  HEENT: PERRLA, oral mucosa moist, no sclera icterus or thrush Respiratory system: Clear to auscultation. Respiratory effort normal. Cardiovascular system: S1 & S2 heard, regular rate and rhythm Gastrointestinal system: Abdomen soft, non-tender, nondistended. Normal bowel sounds   Central nervous system: Alert and oriented. No focal neurological deficits. Extremities: No cyanosis, clubbing or edema Skin: No rashes or ulcers Psychiatry:  Mood & affect appropriate.     Data  Reviewed: I have personally reviewed following labs and imaging studies  CBC: Recent Labs  Lab 07/04/20 0113 07/04/20 0251 07/05/20 0412 07/07/20 0805  WBC 25.5* 24.5* 15.4* 9.8  NEUTROABS 22.5* 20.7* 13.5*  --   HGB 14.3 14.5 11.8* 9.1*  HCT 40.1 40.7 34.0* 27.8*  MCV 86.1 86.8 89.9 90.8  PLT 203 195 122* 115*   Basic Metabolic Panel: Recent Labs  Lab 07/04/20 0755 07/04/20 1458 07/05/20 0412 07/05/20 0952 07/05/20 1854 07/06/20 0421  NA 133*  --  133* 136 140 143  K 4.8  --  4.5 4.7 4.3 4.0  CL 95*  --  101 105 108 112*  CO2 19*  --  18* 17* 20* 20*  GLUCOSE  90  --  94 92 90 95  BUN 73*  --  91* 89* 85* 75*  CREATININE 3.37*  --  4.14* 4.15* 3.90* 3.71*  CALCIUM 7.8*  --  7.2* 7.3* 7.5* 7.9*  MG  --  2.8*  --   --   --   --   PHOS  --  6.9*  --   --   --   --    GFR: CrCl cannot be calculated (Unknown ideal weight.). Liver Function Tests: Recent Labs  Lab 07/04/20 0113 07/04/20 0251 07/04/20 1458 07/05/20 0412  AST 802* 928* 1,085* 1,100*  ALT 359* 384* 443* 525*  ALKPHOS 82 83 63 69  BILITOT 2.5* 3.0* 1.6* 1.3*  PROT 7.7 7.8 6.3* 5.6*  ALBUMIN 4.8 4.7 3.7 3.2*   No results for input(s): LIPASE, AMYLASE in the last 168 hours. No results for input(s): AMMONIA in the last 168 hours. Coagulation Profile: Recent Labs  Lab 07/04/20 0639 07/05/20 0412  INR 1.3* 1.3*   Cardiac Enzymes: Recent Labs  Lab 07/04/20 0251 07/05/20 0412 07/06/20 0421 07/07/20 0352 07/07/20 0805  CKTOTAL 28,714* 43,297* 15,385* 6,094* 4,884*   BNP (last 3 results) No results for input(s): PROBNP in the last 8760 hours. HbA1C: No results for input(s): HGBA1C in the last 72 hours. CBG: Recent Labs  Lab 07/06/20 1144 07/06/20 1733 07/06/20 1749 07/06/20 2345 07/07/20 0602  GLUCAP 78 69* 91 101* 94   Lipid Profile: No results for input(s): CHOL, HDL, LDLCALC, TRIG, CHOLHDL, LDLDIRECT in the last 72 hours. Thyroid Function Tests: No results for input(s): TSH,  T4TOTAL, FREET4, T3FREE, THYROIDAB in the last 72 hours. Anemia Panel: No results for input(s): VITAMINB12, FOLATE, FERRITIN, TIBC, IRON, RETICCTPCT in the last 72 hours. Urine analysis:    Component Value Date/Time   COLORURINE YELLOW 07/05/2020 1400   APPEARANCEUR HAZY (A) 07/05/2020 1400   LABSPEC 1.010 07/05/2020 1400   PHURINE 5.0 07/05/2020 1400   GLUCOSEU NEGATIVE 07/05/2020 1400   HGBUR LARGE (A) 07/05/2020 1400   BILIRUBINUR NEGATIVE 07/05/2020 1400   KETONESUR 5 (A) 07/05/2020 1400   PROTEINUR 30 (A) 07/05/2020 1400   NITRITE NEGATIVE 07/05/2020 1400   LEUKOCYTESUR NEGATIVE 07/05/2020 1400   Sepsis Labs: @LABRCNTIP (procalcitonin:4,lacticidven:4) ) Recent Results (from the past 240 hour(s))  Resp Panel by RT-PCR (Flu A&B, Covid) Nasopharyngeal Swab     Status: None   Collection Time: 07/04/20  3:08 AM   Specimen: Nasopharyngeal Swab; Nasopharyngeal(NP) swabs in vial transport medium  Result Value Ref Range Status   SARS Coronavirus 2 by RT PCR NEGATIVE NEGATIVE Final    Comment: (NOTE) SARS-CoV-2 target nucleic acids are NOT DETECTED.  The SARS-CoV-2 RNA is generally detectable in upper respiratory specimens during the acute phase of infection. The lowest concentration of SARS-CoV-2 viral copies this assay can detect is 138 copies/mL. A negative result does not preclude SARS-Cov-2 infection and should not be used as the sole basis for treatment or other patient management decisions. A negative result may occur with  improper specimen collection/handling, submission of specimen other than nasopharyngeal swab, presence of viral mutation(s) within the areas targeted by this assay, and inadequate number of viral copies(<138 copies/mL). A negative result must be combined with clinical observations, patient history, and epidemiological information. The expected result is Negative.  Fact Sheet for Patients:  07/06/20  Fact Sheet for  Healthcare Providers:  BloggerCourse.com  This test is no t yet approved or cleared by the [employer] and  has been authorized  for detection and/or diagnosis of SARS-CoV-2 by FDA under an Emergency Use Authorization (EUA). This EUA will remain  in effect (meaning this test can be used) for the duration of the COVID-19 declaration under Section 564(b)(1) of the Act, 21 U.S.C.section 360bbb-3(b)(1), unless the authorization is terminated  or revoked sooner.       Influenza A by PCR NEGATIVE NEGATIVE Final   Influenza B by PCR NEGATIVE NEGATIVE Final    Comment: (NOTE) The Xpert Xpress SARS-CoV-2/FLU/RSV plus assay is intended as an aid in the diagnosis of influenza from Nasopharyngeal swab specimens and should not be used as a sole basis for treatment. Nasal washings and aspirates are unacceptable for Xpert Xpress SARS-CoV-2/FLU/RSV testing.  Fact Sheet for Patients: BloggerCourse.com  Fact Sheet for Healthcare Providers: [employer]ious[employer]ker[employer]it  This test is not yet approved or cleared by the [place] FDA and has been authorized for detection and/or diagnosis of SARS-CoV-2 by FDA under an Emergency Use Authorization (EUA). This EUA will remain in effect (meaning this test can be used) for the duration of the COVID-19 declaration under Section 564(b)(1) of the Act, 21 U.S.C. section 360bbb-3(b)(1), unless the authorization is terminated or revoked.  Performed at [facility], [address] [place], [place] [address]   Culture, blood (routine x 2)     Status: Abnormal (Preliminary result)   Collection Time: 07/04/20  6:13 AM   Specimen: BLOOD  Result Value Ref Range Status   Specimen Description BLOOD RIGHT ANTECUBITAL  Final   Special Requests   Final    BOTTLES DRAWN AEROBIC AND ANAEROBIC Blood Culture adequate volume   Culture  Setup Time   Final    GRAM POSITIVE COCCI IN CLUSTERS IN  BOTH AEROBIC AND ANAEROBIC BOTTLES CRITICAL RESULT CALLED TO, READ BACK BY AND VERIFIED WITH: J LEDFORD PHARMD 07/04/20 2316 JDW    Culture (A)  Final    STAPHYLOCOCCUS AUREUS CULTURE REINCUBATED FOR BETTER GROWTH Performed at [facility], [address], [place], [place] [address]    Report Status PENDING  Incomplete   Organism ID, Bacteria STAPHYLOCOCCUS AUREUS  Final      Susceptibility   Staphylococcus aureus - MIC*    CIPROFLOXACIN <=0.5 SENSITIVE Sensitive     ERYTHROMYCIN >=8 RESISTANT Resistant     GENTAMICIN <=0.5 SENSITIVE Sensitive     OXACILLIN <=0.25 SENSITIVE Sensitive     TETRACYCLINE <=1 SENSITIVE Sensitive     VANCOMYCIN <=0.5 SENSITIVE Sensitive     TRIMETH/SULFA <=10 SENSITIVE Sensitive     CLINDAMYCIN RESISTANT Resistant     RIFAMPIN <=0.5 SENSITIVE Sensitive     Inducible Clindamycin POSITIVE Resistant     * STAPHYLOCOCCUS AUREUS  Blood Culture ID Panel (Reflexed)     Status: Abnormal   Collection Time: 07/04/20  6:13 AM  Result Value Ref Range Status   Enterococcus faecalis NOT DETECTED NOT DETECTED Final   Enterococcus Faecium NOT DETECTED NOT DETECTED Final   Listeria monocytogenes NOT DETECTED NOT DETECTED Final   Staphylococcus species DETECTED (A) NOT DETECTED Final    Comment: CRITICAL RESULT CALLED TO, READ BACK BY AND VERIFIED WITH: J LEDFORD PHARMD 07/04/20 2316 JDW    Staphylococcus aureus (BCID) DETECTED (A) NOT DETECTED Final    Comment: Methicillin (oxacillin) susceptible Staphylococcus aureus (MSSA). Preferred therapy is anti staphylococcal beta lactam antibiotic (Cefazolin or Nafcillin), unless clinically contraindicated. CRITICAL RESULT CALLED TO, READ BACK BY AND VERIFIED WITH: J [facility] 07/04/20 2316 JDW    Staphylococcus epidermidis DETECTED (  A) NOT DETECTED Final    Comment: CRITICAL RESULT CALLED TO, READ BACK BY AND VERIFIED WITH: J LEDFORD PHARMD 07/04/20 2316 JDW    Staphylococcus lugdunensis NOT DETECTED NOT DETECTED  Final   Streptococcus species DETECTED (A) NOT DETECTED Final    Comment: Not Enterococcus species, Streptococcus agalactiae, Streptococcus pyogenes, or Streptococcus pneumoniae. CRITICAL RESULT CALLED TO, READ BACK BY AND VERIFIED WITH: J LEDFORD PHARMD 07/04/20 2316 JDW    Streptococcus agalactiae NOT DETECTED NOT DETECTED Final   Streptococcus pneumoniae NOT DETECTED NOT DETECTED Final   Streptococcus pyogenes NOT DETECTED NOT DETECTED Final   A.calcoaceticus-baumannii NOT DETECTED NOT DETECTED Final   Bacteroides fragilis NOT DETECTED NOT DETECTED Final   Enterobacterales NOT DETECTED NOT DETECTED Final   Enterobacter cloacae complex NOT DETECTED NOT DETECTED Final   Escherichia coli NOT DETECTED NOT DETECTED Final   Klebsiella aerogenes NOT DETECTED NOT DETECTED Final   Klebsiella oxytoca NOT DETECTED NOT DETECTED Final   Klebsiella pneumoniae NOT DETECTED NOT DETECTED Final   Proteus species NOT DETECTED NOT DETECTED Final   Salmonella species NOT DETECTED NOT DETECTED Final   Serratia marcescens NOT DETECTED NOT DETECTED Final   Haemophilus influenzae NOT DETECTED NOT DETECTED Final   Neisseria meningitidis NOT DETECTED NOT DETECTED Final   Pseudomonas aeruginosa NOT DETECTED NOT DETECTED Final   Stenotrophomonas maltophilia NOT DETECTED NOT DETECTED Final   Candida albicans NOT DETECTED NOT DETECTED Final   Candida auris NOT DETECTED NOT DETECTED Final   Candida glabrata NOT DETECTED NOT DETECTED Final   Candida krusei NOT DETECTED NOT DETECTED Final   Candida parapsilosis NOT DETECTED NOT DETECTED Final   Candida tropicalis NOT DETECTED NOT DETECTED Final   Cryptococcus neoformans/gattii NOT DETECTED NOT DETECTED Final   Methicillin resistance mecA/C NOT DETECTED NOT DETECTED Final   Meth resistant mecA/C and MREJ NOT DETECTED NOT DETECTED Final    Comment: Performed at [facility], [address], [place], [place] [address]  Culture, blood (routine x 2)     Status:  None (Preliminary result)   Collection Time: 07/04/20  7:55 AM   Specimen: BLOOD RIGHT HAND  Result Value Ref Range Status   Specimen Description BLOOD RIGHT HAND  Final   Special Requests   Final    BOTTLES DRAWN AEROBIC AND ANAEROBIC Blood Culture adequate volume   Culture   Final    NO GROWTH 3 DAYS Performed at [facility], [address] [place], [place] [address]    Report Status PENDING  Incomplete  Culture, blood (routine x 2)     Status: None (Preliminary result)   Collection Time: 07/06/20  4:26 AM   Specimen: BLOOD  Result Value Ref Range Status   Specimen Description BLOOD RIGHT ANTECUBITAL  Final   Special Requests AEROBIC BOTTLE ONLY Blood Culture adequate volume  Final   Culture   Final    NO GROWTH 1 DAY Performed at [facility], [address] [place], [place] [address]    Report Status PENDING  Incomplete  Culture, blood (routine x 2)     Status: None (Preliminary result)   Collection Time: 07/06/20  4:29 AM   Specimen: BLOOD  Result Value Ref Range Status   Specimen Description BLOOD LEFT ANTECUBITAL  Final   Special Requests AEROBIC BOTTLE ONLY Blood Culture adequate volume  Final   Culture   Final    NO GROWTH 1 DAY Performed at [facility], [address]  [place], [place] [address]    Report Status PENDING  Incomplete         Radiology Studies: CT HEAD WO CONTRAST  Result Date: 07/06/2020 CLINICAL DATA:  Mental status changes of unknown cause EXAM: CT HEAD WITHOUT CONTRAST TECHNIQUE: Contiguous axial images were obtained from the base of the skull through the vertex without intravenous contrast. Sagittal and coronal MPR images reconstructed from axial data set. COMPARISON:  None FINDINGS: Brain: Normal ventricular morphology. No midline shift or mass effect. Normal appearance of brain parenchyma. No intracranial hemorrhage, mass lesion, or evidence of acute infarction. No extra-axial fluid collections. Vascular: No hyperdense  vessels Skull: Intact Sinuses/Orbits: Clear Other: N/A IMPRESSION: Normal exam. Electronically Signed   By: Ulyses Southward M.D.   On: 07/06/2020 15:00   ECHOCARDIOGRAM COMPLETE  Result Date: 07/05/2020    ECHOCARDIOGRAM REPORT   Patient Name:   MCKENNON ZWART Date of Exam: 07/05/2020 Medical Rec #:  [id number]        Height: Accession #:    [id number]       Weight: Date of Birth:  March 03, 1991        BSA: Patient Age:    30 years         BP:           139/81 mmHg Patient Gender: M                HR:           115 bpm. Exam Location:  Inpatient Procedure: 2D Echo, Cardiac Doppler and Color Doppler Indications:    Bacteremia R78.81  History:        Patient has no prior history of Echocardiogram examinations.  Sonographer:    Eulah Pont RDCS Referring Phys: [id number] [facility] IMPRESSIONS  1. Left ventricular ejection fraction, by estimation, is 60 to 65%. The left ventricle has normal function. The left ventricle has no regional wall motion abnormalities. Left ventricular diastolic parameters were normal.  2. Right ventricular systolic function is normal. The right ventricular size is normal.  3. The mitral valve is normal in structure. No evidence of mitral valve regurgitation. No evidence of mitral stenosis.  4. The aortic valve is normal in structure. Aortic valve regurgitation is not visualized. No aortic stenosis is present.  5. The inferior vena cava is normal in size with greater than 50% respiratory variability, suggesting right atrial pressure of 3 mmHg. Conclusion(s)/Recommendation(s): No evidence of valvular vegetations on this transthoracic echocardiogram. Would recommend a transesophageal echocardiogram to exclude infective endocarditis if clinically indicated. FINDINGS  Left Ventricle: Left ventricular ejection fraction, by estimation, is 60 to 65%. The left ventricle has normal function. The left ventricle has no regional wall motion abnormalities. The left ventricular internal cavity size was  normal in size. There is  no left ventricular hypertrophy. Left ventricular diastolic parameters were normal. Normal left ventricular filling pressure. Right Ventricle: The right ventricular size is normal. No increase in right ventricular wall thickness. Right ventricular systolic function is normal. Left Atrium: Left atrial size was normal in size. Right Atrium: Right atrial size was normal in size. Pericardium: There is no evidence of pericardial effusion. Mitral Valve: The mitral valve is normal in structure. No evidence of mitral valve regurgitation. No evidence of mitral valve stenosis. Tricuspid Valve: The tricuspid valve is normal in structure. Tricuspid valve regurgitation is not demonstrated. No evidence of tricuspid stenosis. Aortic Valve: The aortic valve is normal in structure. Aortic valve regurgitation is not visualized. No aortic stenosis  is present. Pulmonic Valve: The pulmonic valve was normal in structure. Pulmonic valve regurgitation is not visualized. No evidence of pulmonic stenosis. Aorta: The aortic root is normal in size and structure. Venous: The inferior vena cava is normal in size with greater than 50% respiratory variability, suggesting right atrial pressure of 3 mmHg. IAS/Shunts: No atrial level shunt detected by color flow Doppler.  LEFT VENTRICLE PLAX 2D LVIDd:         4.60 cm  Diastology LVIDs:         2.60 cm  LV e' medial:    15.70 cm/s LV PW:         1.10 cm  LV E/e' medial:  6.1 LV IVS:        1.00 cm  LV e' lateral:   14.50 cm/s LVOT diam:     2.30 cm  LV E/e' lateral: 6.6 LV SV:         120 LVOT Area:     4.15 cm  RIGHT VENTRICLE RV S prime:     17.50 cm/s TAPSE (M-mode): 2.7 cm LEFT ATRIUM             RIGHT ATRIUM LA diam:        2.50 cm RA Area:     13.00 cm LA Vol (A2C):   28.8 ml RA Volume:   28.40 ml LA Vol (A4C):   30.4 ml LA Biplane Vol: 30.6 ml  AORTIC VALVE LVOT Vmax:   132.00 cm/s LVOT Vmean:  102.000 cm/s LVOT VTI:    0.290 m  AORTA Ao Root diam: 3.70 cm Ao Asc  diam:  3.00 cm MITRAL VALVE MV Area (PHT): 3.37 cm    SHUNTS MV Decel Time: 225 msec    Systemic VTI:  0.29 m MV E velocity: 95.10 cm/s  Systemic Diam: 2.30 cm MV A velocity: 84.90 cm/s MV E/A ratio:  1.12 Mihai Croitoru MD Electronically signed by Thurmon Fair MD Signature Date/Time: 07/05/2020/4:53:37 PM    Final       Scheduled Meds: . folic acid  1 mg Oral Daily  . multivitamin with minerals  1 tablet Oral Daily  . nicotine  21 mg Transdermal Daily  . thiamine  100 mg Oral Daily   Or  . thiamine  100 mg Intravenous Daily   Continuous Infusions: .  ceFAZolin (ANCEF) IV 2 g (07/07/20 0851)     LOS: 3 days      Calvert Cantor, MD [facility] Pager: [email or web address] 07/07/2020, 11:39 AM

## 2020-07-07 NOTE — Progress Notes (Addendum)
Attempted to consent patient for TEE. He states he just got back into town and is healing and will refuse TEE. I confirmed a second time that he refused TEE.    Marcelino Duster, PA-C 07/07/2020, 4:53 PM [phone] [facility] [address] [place], [place] [address]

## 2020-07-07 NOTE — Progress Notes (Signed)
Untied patient's right wrist to give patient freedom to access water to drink and to trial less restrictive restraints at about 0400. After at time of reassessment patient's bed alarm activated and upon entering the room patient was standing at the foot of the bed removing his condom cath. Patient was unsteady and staff attempted to stabilize patient when he became agitated and verbally abusive toward staff. Security and patient's mother was called. As patient was talking to his mother on the phone his gait became too unsteady and for safety he was escorted to his bed. Security arrived and patient was placed back in 4 point restraints. IV access was lost during this time and an IV team consult was placed. Safety interventions were explained to patient. Patient's mother was understanding and will visit the patient this morning. Will continue to monitor.

## 2020-07-08 ENCOUNTER — Encounter (HOSPITAL_COMMUNITY)
Admission: EM | Disposition: A | Discharge status: Home / Self Care | Payer: Self-pay | Source: Home / Self Care | Attending: Internal Medicine

## 2020-07-08 DIAGNOSIS — G9340 Encephalopathy, unspecified: Secondary | ICD-10-CM | POA: Diagnosis not present

## 2020-07-08 LAB — COMPREHENSIVE METABOLIC PANEL
ALT: 79 U/L — ABNORMAL HIGH (ref 0–44)
AST: 205 U/L — ABNORMAL HIGH (ref 15–41)
Albumin: 2.8 g/dL — ABNORMAL LOW (ref 3.5–5.0)
Alkaline Phosphatase: 57 U/L (ref 38–126)
Anion gap: 11 (ref 5–15)
BUN: 18 mg/dL (ref 6–20)
CO2: 23 mmol/L (ref 22–32)
Calcium: 8.7 mg/dL — ABNORMAL LOW (ref 8.9–10.3)
Chloride: 108 mmol/L (ref 98–111)
Creatinine, Ser: 1.45 mg/dL — ABNORMAL HIGH (ref 0.61–1.24)
GFR, Estimated: >60 mL/min (ref >60)
Glucose, Bld: 111 mg/dL — ABNORMAL HIGH (ref 70–99)
Potassium: 4.1 mmol/L (ref 3.5–5.1)
Sodium: 142 mmol/L (ref 135–145)
Total Bilirubin: 1.1 mg/dL (ref 0.3–1.2)
Total Protein: 5.2 g/dL — ABNORMAL LOW (ref 6.5–8.1)

## 2020-07-08 LAB — C4 COMPLEMENT: Complement C4, Body Fluid: 18 mg/dL (ref 12–38)

## 2020-07-08 LAB — RENAL FUNCTION PANEL
Albumin: 2.7 g/dL — ABNORMAL LOW (ref 3.5–5.0)
Anion gap: 7 (ref 5–15)
BUN: 25 mg/dL — ABNORMAL HIGH (ref 6–20)
CO2: 24 mmol/L (ref 22–32)
Calcium: 8.5 mg/dL — ABNORMAL LOW (ref 8.9–10.3)
Chloride: 108 mmol/L (ref 98–111)
Creatinine, Ser: 1.55 mg/dL — ABNORMAL HIGH (ref 0.61–1.24)
GFR, Estimated: >60 mL/min (ref >60)
Glucose, Bld: 93 mg/dL (ref 70–99)
Phosphorus: 2.9 mg/dL (ref 2.5–4.6)
Potassium: 3.6 mmol/L (ref 3.5–5.1)
Sodium: 139 mmol/L (ref 135–145)

## 2020-07-08 LAB — CK: Total CK: 2903 U/L — ABNORMAL HIGH (ref 49–397)

## 2020-07-08 LAB — MPO/PR-3 (ANCA) ANTIBODIES
ANCA Proteinase 3: <3.5 U/mL (ref 0.0–3.5)
Myeloperoxidase Abs: <9 U/mL (ref 0.0–9.0)

## 2020-07-08 LAB — ANA W/REFLEX IF POSITIVE: Anti Nuclear Antibody (ANA): NEGATIVE

## 2020-07-08 LAB — GLUCOSE, CAPILLARY
Glucose-Capillary: 119 mg/dL — ABNORMAL HIGH (ref 70–99)
Glucose-Capillary: 90 mg/dL (ref 70–99)
Glucose-Capillary: 98 mg/dL (ref 70–99)

## 2020-07-08 LAB — C3 COMPLEMENT: C3 Complement: 84 mg/dL (ref 82–167)

## 2020-07-08 SURGERY — ECHOCARDIOGRAM, TRANSESOPHAGEAL
Anesthesia: Monitor Anesthesia Care

## 2020-07-08 MED ORDER — MELATONIN 5 MG PO TABS
5.0000 mg | ORAL_TABLET | Freq: Every day | ORAL | Status: DC
  Administered 2020-07-08 – 2020-07-11 (×4): 5 mg via ORAL
  Filled 2020-07-08 (×4): qty 1

## 2020-07-08 NOTE — Progress Notes (Signed)
[facility] ROUNDING NOTE   Subjective:   Interval History: This is a 30 year old male with a history of polysubstance abuse including alcohol heroin and cocaine.  He also has a history of hepatitis C he presents to the emergency room in agitated state in the setting of recent IV drug use.  He was found to have acute kidney injury rhabdomyolysis and staph aureus and Streptococcus bacteremia.  His CK peaked at 43,000 on 07/05/2020 is improving.  His creatinine peaked at 4.14 mg/dL 1/61 0960.  Urine does reveal some RBCs per high-power field 1 WBC 6-10 per high-power field.  Serologies have been sent although working diagnosis of rhabdomyolysis seems likely.  Patient is relatively stable over the past 24 hours.  Urine output increasing significantly.  Creatinine continues to improve.  CK downtrending.  No complaints today.   Objective:  Vital signs in last 24 hours:  Temp:  [97.9 F (36.6 C)-99.7 F (37.6 C)] 98.9 F (37.2 C) (02/18 1248) Pulse Rate:  [79-104] 79 (02/18 1248) Resp:  [18-20] 20 (02/18 1248) BP: (113-159)/(88-98) 113/94 (02/18 1248) SpO2:  [98 %-100 %] 100 % (02/18 1248)  Weight change:  Filed Weights   07/05/20 0800 07/06/20 0600  Weight: 95 kg 96.3 kg    Intake/Output: I/O last 3 completed shifts: In: 5210 [P.O.:5210] Out: 6550 [Urine:6550]   Intake/Output this shift:  Total I/O In: -  Out: 600 [Urine:600]  GEN: lying in bed, groggy but no complaints HEENT sclerae anicteric and has dry, cracked lips NECK no overt JVD PULM clear bilaterally CV RRR systolic murmur present ABD soft, nontender EXT trace edema in all 4 extremities NEURO groggy, slurred speech but answers appropriately SKIN: some blisters on hands and feet   Basic Metabolic Panel: Recent Labs  Lab 07/04/20 1458 07/05/20 0412 07/05/20 0952 07/05/20 1854 07/06/20 0421 07/07/20 0805 07/08/20 0329  NA  --    < > 136 140 143 141 139  K  --    < > 4.7 4.3 4.0 3.4* 3.6  CL   --    < > 105 108 112* 110 108  CO2  --    < > 17* 20* 20* 22 24  GLUCOSE  --    < > 92 90 95 96 93  BUN  --    < > 89* 85* 75* 37* 25*  CREATININE  --    < > 4.15* 3.90* 3.71* 1.92* 1.55*  CALCIUM  --    < > 7.3* 7.5* 7.9* 8.0* 8.5*  MG 2.8*  --   --   --   --   --   --   PHOS 6.9*  --   --   --   --   --  2.9   < > = values in this interval not displayed.    Liver Function Tests: Recent Labs  Lab 07/04/20 0113 07/04/20 0251 07/04/20 1458 07/05/20 0412 07/07/20 0805 07/08/20 0329  AST 802* 928* 1,085* 1,100* 398*  --   ALT 359* 384* 443* 525* 173*  --   ALKPHOS 82 83 63 69 62  --   BILITOT 2.5* 3.0* 1.6* 1.3* 1.1  --   PROT 7.7 7.8 6.3* 5.6* 5.1*  --   ALBUMIN 4.8 4.7 3.7 3.2* 2.7* 2.7*   No results for input(s): LIPASE, AMYLASE in the last 168 hours. No results for input(s): AMMONIA in the last 168 hours.  CBC: Recent Labs  Lab 07/04/20 0113 07/04/20 0251 07/05/20 4540 07/07/20 0805  WBC 25.5* 24.5* 15.4* 9.8  NEUTROABS 22.5* 20.7* 13.5*  --   HGB 14.3 14.5 11.8* 9.1*  HCT 40.1 40.7 34.0* 27.8*  MCV 86.1 86.8 89.9 90.8  PLT 203 195 122* 115*    Cardiac Enzymes: Recent Labs  Lab 07/05/20 0412 07/06/20 0421 07/07/20 0352 07/07/20 0805 07/08/20 0329  CKTOTAL 43,297* 15,385* 6,094* 4,884* 2,903*    BNP: Invalid input(s): POCBNP  CBG: Recent Labs  Lab 07/07/20 1212 07/07/20 1627 07/07/20 2346 07/08/20 0631 07/08/20 1253  GLUCAP 132* 101* 82 119* 90    Microbiology: Results for orders placed or performed during the hospital encounter of 07/04/20  Resp Panel by RT-PCR (Flu A&B, Covid) Nasopharyngeal Swab     Status: None   Collection Time: 07/04/20  3:08 AM   Specimen: Nasopharyngeal Swab; Nasopharyngeal(NP) swabs in vial transport medium  Result Value Ref Range Status   SARS Coronavirus 2 by RT PCR NEGATIVE NEGATIVE Final    Comment: (NOTE) SARS-CoV-2 target nucleic acids are NOT DETECTED.  The SARS-CoV-2 RNA is generally detectable in upper  respiratory specimens during the acute phase of infection. The lowest concentration of SARS-CoV-2 viral copies this assay can detect is 138 copies/mL. A negative result does not preclude SARS-Cov-2 infection and should not be used as the sole basis for treatment or other patient management decisions. A negative result may occur with  improper specimen collection/handling, submission of specimen other than nasopharyngeal swab, presence of viral mutation(s) within the areas targeted by this assay, and inadequate number of viral copies(<138 copies/mL). A negative result must be combined with clinical observations, patient history, and epidemiological information. The expected result is Negative.  Fact Sheet for Patients:  BloggerCourse.comhttps://www.fda.gov/media/152166/download  Fact Sheet for Healthcare Providers:  SeriousBroker.ithttps://www.fda.gov/media/152162/download  This test is no t yet approved or cleared by the [place] FDA and  has been authorized for detection and/or diagnosis of SARS-CoV-2 by FDA under an Emergency Use Authorization (EUA). This EUA will remain  in effect (meaning this test can be used) for the duration of the COVID-19 declaration under Section 564(b)(1) of the Act, 21 U.S.C.section 360bbb-3(b)(1), unless the authorization is terminated  or revoked sooner.       Influenza A by PCR NEGATIVE NEGATIVE Final   Influenza B by PCR NEGATIVE NEGATIVE Final    Comment: (NOTE) The Xpert Xpress SARS-CoV-2/FLU/RSV plus assay is intended as an aid in the diagnosis of influenza from Nasopharyngeal swab specimens and should not be used as a sole basis for treatment. Nasal washings and aspirates are unacceptable for Xpert Xpress SARS-CoV-2/FLU/RSV testing.  Fact Sheet for Patients: BloggerCourse.comhttps://www.fda.gov/media/152166/download  Fact Sheet for Healthcare Providers: SeriousBroker.ithttps://www.fda.gov/media/152162/download  This test is not yet approved or cleared by the [place] FDA and has been  authorized for detection and/or diagnosis of SARS-CoV-2 by FDA under an Emergency Use Authorization (EUA). This EUA will remain in effect (meaning this test can be used) for the duration of the COVID-19 declaration under Section 564(b)(1) of the Act, 21 U.S.C. section 360bbb-3(b)(1), unless the authorization is terminated or revoked.  Performed at [facility], [address] [place], [place] [id number]   Culture, blood (routine x 2)     Status: Abnormal (Preliminary result)   Collection Time: 07/04/20  6:13 AM   Specimen: BLOOD  Result Value Ref Range Status   Specimen Description BLOOD RIGHT ANTECUBITAL  Final   Special Requests   Final    BOTTLES DRAWN AEROBIC AND ANAEROBIC Blood Culture adequate volume   Culture  Setup Time   Final    GRAM POSITIVE COCCI IN CLUSTERS IN BOTH AEROBIC AND ANAEROBIC BOTTLES CRITICAL RESULT CALLED TO, READ BACK BY AND VERIFIED WITH: Melven [facility] 07/04/20 2316 JDW Performed at [facility], [address] [place], [place] [address]    Culture STAPHYLOCOCCUS AUREUS (A)  Final   Report Status PENDING  Incomplete   Organism ID, Bacteria STAPHYLOCOCCUS AUREUS  Final      Susceptibility   Staphylococcus aureus - MIC*    CIPROFLOXACIN <=0.5 SENSITIVE Sensitive     ERYTHROMYCIN >=8 RESISTANT Resistant     GENTAMICIN <=0.5 SENSITIVE Sensitive     OXACILLIN <=0.25 SENSITIVE Sensitive     TETRACYCLINE <=1 SENSITIVE Sensitive     VANCOMYCIN <=0.5 SENSITIVE Sensitive     TRIMETH/SULFA <=10 SENSITIVE Sensitive     CLINDAMYCIN RESISTANT Resistant     RIFAMPIN <=0.5 SENSITIVE Sensitive     Inducible Clindamycin POSITIVE Resistant     * STAPHYLOCOCCUS AUREUS  Blood Culture ID Panel (Reflexed)     Status: Abnormal   Collection Time: 07/04/20  6:13 AM  Result Value Ref Range Status   Enterococcus faecalis NOT DETECTED NOT DETECTED Final   Enterococcus Faecium NOT DETECTED NOT DETECTED Final   Listeria monocytogenes NOT DETECTED NOT DETECTED  Final   Staphylococcus species DETECTED (A) NOT DETECTED Final    Comment: CRITICAL RESULT CALLED TO, READ BACK BY AND VERIFIED WITH: J LEDFORD PHARMD 07/04/20 2316 JDW    Staphylococcus aureus (BCID) DETECTED (A) NOT DETECTED Final    Comment: Methicillin (oxacillin) susceptible Staphylococcus aureus (MSSA). Preferred therapy is anti staphylococcal beta lactam antibiotic (Cefazolin or Nafcillin), unless clinically contraindicated. CRITICAL RESULT CALLED TO, READ BACK BY AND VERIFIED WITH: J LEDFORD [facility] 07/04/20 2316 JDW    Staphylococcus epidermidis DETECTED (A) NOT DETECTED Final    Comment: CRITICAL RESULT CALLED TO, READ BACK BY AND VERIFIED WITH: J LEDFORD PHARMD 07/04/20 2316 JDW    Staphylococcus lugdunensis NOT DETECTED NOT DETECTED Final   Streptococcus species DETECTED (A) NOT DETECTED Final    Comment: Not Enterococcus species, Streptococcus agalactiae, Streptococcus pyogenes, or Streptococcus pneumoniae. CRITICAL RESULT CALLED TO, READ BACK BY AND VERIFIED WITH: J LEDFORD PHARMD 07/04/20 2316 JDW    Streptococcus agalactiae NOT DETECTED NOT DETECTED Final   Streptococcus pneumoniae NOT DETECTED NOT DETECTED Final   Streptococcus pyogenes NOT DETECTED NOT DETECTED Final   A.calcoaceticus-baumannii NOT DETECTED NOT DETECTED Final   Bacteroides fragilis NOT DETECTED NOT DETECTED Final   Enterobacterales NOT DETECTED NOT DETECTED Final   Enterobacter cloacae complex NOT DETECTED NOT DETECTED Final   Escherichia coli NOT DETECTED NOT DETECTED Final   Klebsiella aerogenes NOT DETECTED NOT DETECTED Final   Klebsiella oxytoca NOT DETECTED NOT DETECTED Final   Klebsiella pneumoniae NOT DETECTED NOT DETECTED Final   Proteus species NOT DETECTED NOT DETECTED Final   Salmonella species NOT DETECTED NOT DETECTED Final   Serratia marcescens NOT DETECTED NOT DETECTED Final   Haemophilus influenzae NOT DETECTED NOT DETECTED Final   Neisseria meningitidis NOT DETECTED NOT DETECTED Final    Pseudomonas aeruginosa NOT DETECTED NOT DETECTED Final   Stenotrophomonas maltophilia NOT DETECTED NOT DETECTED Final   Candida albicans NOT DETECTED NOT DETECTED Final   Candida auris NOT DETECTED NOT DETECTED Final   Candida glabrata NOT DETECTED NOT DETECTED Final   Candida krusei NOT DETECTED NOT DETECTED Final   Candida parapsilosis NOT DETECTED NOT DETECTED Final   Candida tropicalis NOT DETECTED NOT DETECTED Final  Cryptococcus neoformans/gattii NOT DETECTED NOT DETECTED Final   Methicillin resistance mecA/C NOT DETECTED NOT DETECTED Final   Meth resistant mecA/C and MREJ NOT DETECTED NOT DETECTED Final    Comment: Performed at [facility], [address] [place], [place] [address]  Culture, blood (routine x 2)     Status: None (Preliminary result)   Collection Time: 07/04/20  7:55 AM   Specimen: BLOOD RIGHT HAND  Result Value Ref Range Status   Specimen Description BLOOD RIGHT HAND  Final   Special Requests   Final    BOTTLES DRAWN AEROBIC AND ANAEROBIC Blood Culture adequate volume   Culture   Final    NO GROWTH 3 DAYS Performed at [facility], [address] [place], [place] [address]    Report Status PENDING  Incomplete  Culture, blood (routine x 2)     Status: None (Preliminary result)   Collection Time: 07/06/20  4:26 AM   Specimen: BLOOD  Result Value Ref Range Status   Specimen Description BLOOD RIGHT ANTECUBITAL  Final   Special Requests AEROBIC BOTTLE ONLY Blood Culture adequate volume  Final   Culture   Final    NO GROWTH 1 DAY Performed at [facility], [address], [place], [place] [address]    Report Status PENDING  Incomplete  Culture, blood (routine x 2)     Status: None (Preliminary result)   Collection Time: 07/06/20  4:29 AM   Specimen: BLOOD  Result Value Ref Range Status   Specimen Description BLOOD LEFT ANTECUBITAL  Final   Special Requests AEROBIC BOTTLE ONLY Blood Culture adequate volume  Final   Culture   Final     NO GROWTH 1 DAY Performed at [facility], [address] [place], [place] [address]    Report Status PENDING  Incomplete    Coagulation Studies: No results for input(s): LABPROT, INR in the last 72 hours.  Urinalysis: Recent Labs    07/05/20 1400  COLORURINE YELLOW  LABSPEC 1.010  PHURINE 5.0  GLUCOSEU NEGATIVE  HGBUR LARGE*  BILIRUBINUR NEGATIVE  KETONESUR 5*  PROTEINUR 30*  NITRITE NEGATIVE  LEUKOCYTESUR NEGATIVE      Imaging: No results found.   Medications:   . sodium chloride 10 mL/hr at 07/07/20 2242  .  ceFAZolin (ANCEF) IV 2 g (07/08/20 0524)   . folic acid  1 mg Oral Daily  . multivitamin with minerals  1 tablet Oral Daily  . nicotine  21 mg Transdermal Daily  . thiamine  100 mg Oral Daily   Or  . thiamine  100 mg Intravenous Daily   sodium chloride, labetalol, lip balm  Assessment/ Plan:  1.  AKI (nonoliguric): baseline creatinine is 1.03 as of October 2021.  CT abd/ pelvis 07/04/20 without obstruction.  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  A biopsy wouldn't be necessary because it wouldn't change the need for IV antibiotic therapy.    GN work-up still pending but given significant improvement this is most likely rhabdo.  We will sign off at this time given significant improvement in kidney function.  Encourage oral hydration and monitor for recovery  2.  Rhabdomyolysis: CK significantly improved.  IV fluids per primary team and encourage oral hydration  3.  MSSA and strep bacteremia: on Ancef, TTE without vegetations,  work-up per primary team  4.  EtOH abuse/ withdrawal: on CIWA protocol.    5.  Polysubstance abuse: was in rehab and got a weekend pass when he had this most recent relapse.    We will sign off at this time given his significant renal recovery.  Please contact  our team if any concerns arise.   LOS: 4 Darnell Level @TODAY @1 :30 PM

## 2020-07-08 NOTE — Progress Notes (Signed)
Has remained out of restraints throughout shift, with sitter at bedside.

## 2020-07-08 NOTE — Progress Notes (Addendum)
PROGRESS NOTE    Zachary Hurst   GUR:[id number]  DOB: September 05, 1990  DOA: 07/04/2020 PCP: Pcp, No   Brief Narrative:  Zachary Hurst is a 30 year old male with a history of heroin and cocaine abuse who was brought to the hospital by EMS with severe acute confusion his girlfriend called them.  According to the girlfriend the patient had been using heroin and cocaine that day and subsequently became extremely aggressive yelling and screaming and flailing his extremities. Further history obtained from the patient's mother.  She states that the patient has been in a drug rehab center for 2 months and was released on Friday on a pass for the weekend.  He visited his mother on Saturday who suspected that he may be high but did not notice any track marks at that time. The patient's mother further states that about 2 years ago the patient was admitted to [facility] after a similar episode of severe agitation when he used heroin.  She states that IV Ativan was ineffective in reducing his agitation and he was eventually placed in a medically induced coma.  He recuperated and was discharged within 6 days of being admitted.  He has been admitted to an inpatient rehab facility on 3 separate occasions but has relapsed each time.  Given Geodon in the ED for his severe agitation and this helped him.  Subjective: Quite sleepy this AM.     Assessment & Plan:   Principal Problem:   Acute encephalopathy - multifactorial-he currently has bacteremia with staph and strep, AKI, and has used heroin which may have been mixed with fentanyl and other substances along with cocaine a few days ago - now out of restraints with a sitter at the bedside but quite sleepy on my exam- no sedative have been given as these have been discontinued  Active Problems:  Staph and streptococcal bacteremia with leukocytosis -Likely related to IV drug abuse-he also snorts cocaine and may have some breakdown of the nasal mucosa-he  also has numerous scabs on his feet and legs which may be entry points for bacteria -Continue cefazolin -2D echo reveals no vegetations-he will need a TEE but is declining this- will attempt to speak with him again -Appreciate ID consult  Drug abuse -With IV drugs and snorting drugs -Mother states that does not have any more money to pay for inpatient rehab facilities that she has paid for these facilities on 3 different occasions -He is a candidate for Suboxone or methadone and hopefully will be able to find a clinic for him to go to once he is closer to discharge  Rhabdomyolysis with AKI and metabolic acidosis -Suspect his AKI is related to rhabdomyolysis and some dehydration -His rhabdomyolysis may be related to the severe agitation and flailing of extremities noted by his girlfriend who sent a video of this to his mother-his mother feels that this is possibly related to fentanyl mixed in with the heroin that he was using -CK levels and Cr now improved-     Elevated LFTs -Likely related to sepsis and rhabdomyolysis related -his mom states that he does not drink alcohol -AST 1085 and ALT 443- also improving    Hepatitis C antibody positive in blood  Thrombocytopenia - acute in the hospital- cont to follow- plt were 115 yesterday- follow    Time spent in minutes: 35-  DVT prophylaxis: SCDs Start: 07/04/20 0224 Code Status: Full code Family Communication: Mother Level of Care: Level of care: Progressive Disposition Plan:  Status is:  Inpatient  Remains inpatient appropriate because:IV treatments appropriate due to intensity of illness or inability to take PO   Dispo: The patient is from: Home              Anticipated d/c is to: Home              Anticipated d/c date is: > 3 days              Patient currently is not medically stable to d/c.   Difficult to place patient No  Consultants:   ID Procedures:   2D echo Antimicrobials:  Anti-infectives (From admission,  onward)   Start     Dose/Rate Route Frequency Ordered Stop   07/07/20 2000  ceFAZolin (ANCEF) IVPB 2g/100 mL premix        2 g 200 mL/hr over 30 Minutes Intravenous Every 8 hours 07/07/20 1617     07/04/20 2345  ceFAZolin (ANCEF) IVPB 2g/100 mL premix  Status:  Discontinued        2 g 200 mL/hr over 30 Minutes Intravenous Every 12 hours 07/04/20 2330 07/07/20 1617   07/04/20 2330  ceFAZolin (ANCEF) IVPB 2g/100 mL premix  Status:  Discontinued        2 g 200 mL/hr over 30 Minutes Intravenous Every 8 hours 07/04/20 2328 07/04/20 2329       Objective: Vitals:   07/07/20 1917 07/07/20 2351 07/08/20 0324 07/08/20 0818  BP: (!) 159/98 (!) 151/89 (!) 146/97 (!) 149/92  Pulse: (!) 104 94 97 97  Resp: 20 20 20 18   Temp: 98.2 F (36.8 C) 98.3 F (36.8 C) 97.9 F (36.6 C)   TempSrc: Oral Oral Oral Oral  SpO2: 100% 98% 100% 100%  Weight:      Height: 6\' 1"  (1.854 m)       Intake/Output Summary (Last 24 hours) at 07/08/2020 1155 Last data filed at 07/08/2020 1015 Gross per 24 hour  Intake 2920 ml  Output 4900 ml  Net -1980 ml   Filed Weights   07/05/20 0800 07/06/20 0600  Weight: 95 kg 96.3 kg    Examination: General exam: Appears comfortable  HEENT: PERRLA, oral mucosa moist, no sclera icterus or thrush Respiratory system: Clear to auscultation. Respiratory effort normal. Cardiovascular system: S1 & S2 heard, regular rate and rhythm Gastrointestinal system: Abdomen soft, non-tender, nondistended. Normal bowel sounds   Central nervous system: asleep Extremities: No cyanosis, clubbing or edema Skin: numerous scabs   Data Reviewed: I have personally reviewed following labs and imaging studies  CBC: Recent Labs  Lab 07/04/20 0113 07/04/20 0251 07/05/20 0412 07/07/20 0805  WBC 25.5* 24.5* 15.4* 9.8  NEUTROABS 22.5* 20.7* 13.5*  --   HGB 14.3 14.5 11.8* 9.1*  HCT 40.1 40.7 34.0* 27.8*  MCV 86.1 86.8 89.9 90.8  PLT 203 195 122* 115*   Basic Metabolic Panel: Recent  Labs  Lab 07/04/20 1458 07/05/20 0412 07/05/20 0952 07/05/20 1854 07/06/20 0421 07/07/20 0805 07/08/20 0329  NA  --    < > 136 140 143 141 139  K  --    < > 4.7 4.3 4.0 3.4* 3.6  CL  --    < > 105 108 112* 110 108  CO2  --    < > 17* 20* 20* 22 24  GLUCOSE  --    < > 92 90 95 96 93  BUN  --    < > 89* 85* 75* 37* 25*  CREATININE  --    < >  4.15* 3.90* 3.71* 1.92* 1.55*  CALCIUM  --    < > 7.3* 7.5* 7.9* 8.0* 8.5*  MG 2.8*  --   --   --   --   --   --   PHOS 6.9*  --   --   --   --   --  2.9   < > = values in this interval not displayed.   GFR: Estimated Creatinine Clearance: 85.3 mL/min (A) (by C-G formula based on SCr of 1.55 mg/dL (H)). Liver Function Tests: Recent Labs  Lab 07/04/20 0113 07/04/20 0251 07/04/20 1458 07/05/20 0412 07/07/20 0805 07/08/20 0329  AST 802* 928* 1,085* 1,100* 398*  --   ALT 359* 384* 443* 525* 173*  --   ALKPHOS 82 83 63 69 62  --   BILITOT 2.5* 3.0* 1.6* 1.3* 1.1  --   PROT 7.7 7.8 6.3* 5.6* 5.1*  --   ALBUMIN 4.8 4.7 3.7 3.2* 2.7* 2.7*   No results for input(s): LIPASE, AMYLASE in the last 168 hours. No results for input(s): AMMONIA in the last 168 hours. Coagulation Profile: Recent Labs  Lab 07/04/20 0639 07/05/20 0412  INR 1.3* 1.3*   Cardiac Enzymes: Recent Labs  Lab 07/05/20 0412 07/06/20 0421 07/07/20 0352 07/07/20 0805 07/08/20 0329  CKTOTAL 76,283* 15,176* 6,094* 4,884* 2,903*   BNP (last 3 results) No results for input(s): PROBNP in the last 8760 hours. HbA1C: No results for input(s): HGBA1C in the last 72 hours. CBG: Recent Labs  Lab 07/07/20 0602 07/07/20 1212 07/07/20 1627 07/07/20 2346 07/08/20 0631  GLUCAP 94 132* 101* 82 119*   Lipid Profile: No results for input(s): CHOL, HDL, LDLCALC, TRIG, CHOLHDL, LDLDIRECT in the last 72 hours. Thyroid Function Tests: No results for input(s): TSH, T4TOTAL, FREET4, T3FREE, THYROIDAB in the last 72 hours. Anemia Panel: No results for input(s): VITAMINB12,  FOLATE, FERRITIN, TIBC, IRON, RETICCTPCT in the last 72 hours. Urine analysis:    Component Value Date/Time   COLORURINE YELLOW 07/05/2020 1400   APPEARANCEUR HAZY (A) 07/05/2020 1400   LABSPEC 1.010 07/05/2020 1400   PHURINE 5.0 07/05/2020 1400   GLUCOSEU NEGATIVE 07/05/2020 1400   HGBUR LARGE (A) 07/05/2020 1400   BILIRUBINUR NEGATIVE 07/05/2020 1400   KETONESUR 5 (A) 07/05/2020 1400   PROTEINUR 30 (A) 07/05/2020 1400   NITRITE NEGATIVE 07/05/2020 1400   LEUKOCYTESUR NEGATIVE 07/05/2020 1400   Sepsis Labs: @LABRCNTIP (procalcitonin:4,lacticidven:4) ) Recent Results (from the past 240 hour(s))  Resp Panel by RT-PCR (Flu A&B, Covid) Nasopharyngeal Swab     Status: None   Collection Time: 07/04/20  3:08 AM   Specimen: Nasopharyngeal Swab; Nasopharyngeal(NP) swabs in vial transport medium  Result Value Ref Range Status   SARS Coronavirus 2 by RT PCR NEGATIVE NEGATIVE Final    Comment: (NOTE) SARS-CoV-2 target nucleic acids are NOT DETECTED.  The SARS-CoV-2 RNA is generally detectable in upper respiratory specimens during the acute phase of infection. The lowest concentration of SARS-CoV-2 viral copies this assay can detect is 138 copies/mL. A negative result does not preclude SARS-Cov-2 infection and should not be used as the sole basis for treatment or other patient management decisions. A negative result may occur with  improper specimen collection/handling, submission of specimen other than nasopharyngeal swab, presence of viral mutation(s) within the areas targeted by this assay, and inadequate number of viral copies(<138 copies/mL). A negative result must be combined with clinical observations, patient history, and epidemiological information. The expected result is Negative.  Fact Sheet for Patients:  BloggerCourse.comhttps://www.fda.gov/media/152166/download  Fact Sheet for Healthcare Providers:  SeriousBroker.ithttps://www.fda.gov/media/152162/download  This test is no t yet approved or cleared by  the [place] FDA and  has been authorized for detection and/or diagnosis of SARS-CoV-2 by FDA under an Emergency Use Authorization (EUA). This EUA will remain  in effect (meaning this test can be used) for the duration of the COVID-19 declaration under Section 564(b)(1) of the Act, 21 U.S.C.section 360bbb-3(b)(1), unless the authorization is terminated  or revoked sooner.       Influenza A by PCR NEGATIVE NEGATIVE Final   Influenza B by PCR NEGATIVE NEGATIVE Final    Comment: (NOTE) The Xpert Xpress SARS-CoV-2/FLU/RSV plus assay is intended as an aid in the diagnosis of influenza from Nasopharyngeal swab specimens and should not be used as a sole basis for treatment. Nasal washings and aspirates are unacceptable for Xpert Xpress SARS-CoV-2/FLU/RSV testing.  Fact Sheet for Patients: BloggerCourse.comhttps://www.fda.gov/media/152166/download  Fact Sheet for Healthcare Providers: SeriousBroker.ithttps://www.fda.gov/media/152162/download  This test is not yet approved or cleared by the [place] FDA and has been authorized for detection and/or diagnosis of SARS-CoV-2 by FDA under an Emergency Use Authorization (EUA). This EUA will remain in effect (meaning this test can be used) for the duration of the COVID-19 declaration under Section 564(b)(1) of the Act, 21 U.S.C. section 360bbb-3(b)(1), unless the authorization is terminated or revoked.  Performed at [facility], [address] [place], [place] [address]   Culture, blood (routine x 2)     Status: Abnormal (Preliminary result)   Collection Time: 07/04/20  6:13 AM   Specimen: BLOOD  Result Value Ref Range Status   Specimen Description BLOOD RIGHT ANTECUBITAL  Final   Special Requests   Final    BOTTLES DRAWN AEROBIC AND ANAEROBIC Blood Culture adequate volume   Culture  Setup Time   Final    GRAM POSITIVE COCCI IN CLUSTERS IN BOTH AEROBIC AND ANAEROBIC BOTTLES CRITICAL RESULT CALLED TO, READ BACK BY AND VERIFIED WITH: Melven SartoriusJ LEDFORD [facility]  07/04/20 2316 JDW Performed at [facility], [address] [place], [place] [address]    Culture STAPHYLOCOCCUS AUREUS (A)  Final   Report Status PENDING  Incomplete   Organism ID, Bacteria STAPHYLOCOCCUS AUREUS  Final      Susceptibility   Staphylococcus aureus - MIC*    CIPROFLOXACIN <=0.5 SENSITIVE Sensitive     ERYTHROMYCIN >=8 RESISTANT Resistant     GENTAMICIN <=0.5 SENSITIVE Sensitive     OXACILLIN <=0.25 SENSITIVE Sensitive     TETRACYCLINE <=1 SENSITIVE Sensitive     VANCOMYCIN <=0.5 SENSITIVE Sensitive     TRIMETH/SULFA <=10 SENSITIVE Sensitive     CLINDAMYCIN RESISTANT Resistant     RIFAMPIN <=0.5 SENSITIVE Sensitive     Inducible Clindamycin POSITIVE Resistant     * STAPHYLOCOCCUS AUREUS  Blood Culture ID Panel (Reflexed)     Status: Abnormal   Collection Time: 07/04/20  6:13 AM  Result Value Ref Range Status   Enterococcus faecalis NOT DETECTED NOT DETECTED Final   Enterococcus Faecium NOT DETECTED NOT DETECTED Final   Listeria monocytogenes NOT DETECTED NOT DETECTED Final   Staphylococcus species DETECTED (A) NOT DETECTED Final    Comment: CRITICAL RESULT CALLED TO, READ BACK BY AND VERIFIED WITH: J LEDFORD PHARMD 07/04/20 2316 JDW    Staphylococcus aureus (BCID) DETECTED (A) NOT DETECTED Final    Comment: Methicillin (oxacillin) susceptible Staphylococcus aureus (MSSA). Preferred therapy is anti staphylococcal beta lactam antibiotic (Cefazolin or Nafcillin), unless clinically contraindicated. CRITICAL  RESULT CALLED TO, READ BACK BY AND VERIFIED WITH: J LEDFORD [facility] 07/04/20 2316 JDW    Staphylococcus epidermidis DETECTED (A) NOT DETECTED Final    Comment: CRITICAL RESULT CALLED TO, READ BACK BY AND VERIFIED WITH: J LEDFORD PHARMD 07/04/20 2316 JDW    Staphylococcus lugdunensis NOT DETECTED NOT DETECTED Final   Streptococcus species DETECTED (A) NOT DETECTED Final    Comment: Not Enterococcus species, Streptococcus agalactiae, Streptococcus pyogenes, or  Streptococcus pneumoniae. CRITICAL RESULT CALLED TO, READ BACK BY AND VERIFIED WITH: J LEDFORD PHARMD 07/04/20 2316 JDW    Streptococcus agalactiae NOT DETECTED NOT DETECTED Final   Streptococcus pneumoniae NOT DETECTED NOT DETECTED Final   Streptococcus pyogenes NOT DETECTED NOT DETECTED Final   A.calcoaceticus-baumannii NOT DETECTED NOT DETECTED Final   Bacteroides fragilis NOT DETECTED NOT DETECTED Final   Enterobacterales NOT DETECTED NOT DETECTED Final   Enterobacter cloacae complex NOT DETECTED NOT DETECTED Final   Escherichia coli NOT DETECTED NOT DETECTED Final   Klebsiella aerogenes NOT DETECTED NOT DETECTED Final   Klebsiella oxytoca NOT DETECTED NOT DETECTED Final   Klebsiella pneumoniae NOT DETECTED NOT DETECTED Final   Proteus species NOT DETECTED NOT DETECTED Final   Salmonella species NOT DETECTED NOT DETECTED Final   Serratia marcescens NOT DETECTED NOT DETECTED Final   Haemophilus influenzae NOT DETECTED NOT DETECTED Final   Neisseria meningitidis NOT DETECTED NOT DETECTED Final   Pseudomonas aeruginosa NOT DETECTED NOT DETECTED Final   Stenotrophomonas maltophilia NOT DETECTED NOT DETECTED Final   Candida albicans NOT DETECTED NOT DETECTED Final   Candida auris NOT DETECTED NOT DETECTED Final   Candida glabrata NOT DETECTED NOT DETECTED Final   Candida krusei NOT DETECTED NOT DETECTED Final   Candida parapsilosis NOT DETECTED NOT DETECTED Final   Candida tropicalis NOT DETECTED NOT DETECTED Final   Cryptococcus neoformans/gattii NOT DETECTED NOT DETECTED Final   Methicillin resistance mecA/C NOT DETECTED NOT DETECTED Final   Meth resistant mecA/C and MREJ NOT DETECTED NOT DETECTED Final    Comment: Performed at [facility], [address], [place], [place] [address]  Culture, blood (routine x 2)     Status: None (Preliminary result)   Collection Time: 07/04/20  7:55 AM   Specimen: BLOOD RIGHT HAND  Result Value Ref Range Status   Specimen Description BLOOD  RIGHT HAND  Final   Special Requests   Final    BOTTLES DRAWN AEROBIC AND ANAEROBIC Blood Culture adequate volume   Culture   Final    NO GROWTH 3 DAYS Performed at [facility], [address] [place], [place] [address]    Report Status PENDING  Incomplete  Culture, blood (routine x 2)     Status: None (Preliminary result)   Collection Time: 07/06/20  4:26 AM   Specimen: BLOOD  Result Value Ref Range Status   Specimen Description BLOOD RIGHT ANTECUBITAL  Final   Special Requests AEROBIC BOTTLE ONLY Blood Culture adequate volume  Final   Culture   Final    NO GROWTH 1 DAY Performed at [facility], [address], [place], [place] [address]    Report Status PENDING  Incomplete  Culture, blood (routine x 2)     Status: None (Preliminary result)   Collection Time: 07/06/20  4:29 AM   Specimen: BLOOD  Result Value Ref Range Status   Specimen Description BLOOD LEFT ANTECUBITAL  Final   Special Requests AEROBIC BOTTLE ONLY Blood Culture adequate volume  Final  Culture   Final    NO GROWTH 1 DAY Performed at [facility], [address], [place], [place] [address]    Report Status PENDING  Incomplete         Radiology Studies: CT HEAD WO CONTRAST  Result Date: 07/06/2020 CLINICAL DATA:  Mental status changes of unknown cause EXAM: CT HEAD WITHOUT CONTRAST TECHNIQUE: Contiguous axial images were obtained from the base of the skull through the vertex without intravenous contrast. Sagittal and coronal MPR images reconstructed from axial data set. COMPARISON:  None FINDINGS: Brain: Normal ventricular morphology. No midline shift or mass effect. Normal appearance of brain parenchyma. No intracranial hemorrhage, mass lesion, or evidence of acute infarction. No extra-axial fluid collections. Vascular: No hyperdense vessels Skull: Intact Sinuses/Orbits: Clear Other: N/A IMPRESSION: Normal exam. Electronically Signed   By: Ulyses Southward M.D.   On: 07/06/2020 15:00       Scheduled Meds: . folic acid  1 mg Oral Daily  . multivitamin with minerals  1 tablet Oral Daily  . nicotine  21 mg Transdermal Daily  . thiamine  100 mg Oral Daily   Or  . thiamine  100 mg Intravenous Daily   Continuous Infusions: . sodium chloride 10 mL/hr at 07/07/20 2242  .  ceFAZolin (ANCEF) IV 2 g (07/08/20 0524)     LOS: 4 days      Calvert Cantor, MD [facility] Pager: [email or web address] 07/08/2020, 11:55 AM

## 2020-07-08 NOTE — Progress Notes (Signed)
[facility] Infectious Diseases Follow Up Note  Patient Identification: Patient Name: Zachary Hurst MRN: [id number] Admit Date: 07/04/2020 12:06 AM Age: 30 y.o.Today's Date: 07/08/2020   Reason for Visit: Bacteremia  Principal Problem:   Acute encephalopathy Active Problems:   Acute delirium   Elevated LFTs   ARF (acute renal failure) (HCC)   Hepatitis C antibody positive in blood   Antibiotics : cefazolin 2/14-  Lines/Tubes - PIVs   Interval Events: afebrile. 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 Thank you for the consult. Please page with pertinent questions or concerns.  ______________________________________________________________________ Subjective patient seen and examined at the bedside. He is awake, alert and follows commands. Mother at bedside. Complains of pain at the rt ankle   Vitals BP (!) 113/94 (BP Location: Right Arm)   Pulse 79   Temp 98.9 F (37.2 C) (Axillary)   Resp 20   Ht 6\' 1"  (1.854 m)   Wt 96.3 kg   SpO2 100%   BMI 28.01 kg/m   Awake, alert and follows commands Neuro - grossly non focal HEENT - dry mucosa  Chest - clear bilaterally CVS- Normal s1s2 RRR, systolic mumur+ Abdomen - soft Extremities - dry scabs in the lower extremiteis  Rt ankle - soreness/tenderness, ROM good  Pertinent Microbiology Results for orders placed or performed during  the hospital encounter of 07/04/20  Resp Panel by RT-PCR (Flu A&B, Covid) Nasopharyngeal Swab     Status: None   Collection Time: 07/04/20  3:08 AM   Specimen: Nasopharyngeal Swab; Nasopharyngeal(NP) swabs in vial transport medium  Result Value Ref Range Status   SARS Coronavirus 2 by RT PCR NEGATIVE NEGATIVE Final    Comment: (NOTE) SARS-CoV-2 target nucleic acids are NOT DETECTED.  The SARS-CoV-2 RNA is generally detectable in upper respiratory specimens during the acute phase of infection. The lowest concentration of SARS-CoV-2 viral copies this assay can detect is 138 copies/mL. A negative result does not preclude SARS-Cov-2 infection and should not be used as the sole basis for treatment or other patient management decisions. A negative result may occur with  improper specimen collection/handling, submission of specimen other than nasopharyngeal swab, presence of viral mutation(s) within the areas targeted by this assay, and inadequate number of viral copies(<138 copies/mL). A negative result must be combined with clinical observations, patient history, and epidemiological information. The expected result is Negative.  Fact Sheet for Patients:  07/06/20  Fact Sheet for Healthcare Providers:  BloggerCourse.com  This test is no t yet approved or cleared by the [place] FDA and  has been authorized for detection and/or diagnosis of SARS-CoV-2 by FDA under an Emergency Use Authorization (EUA). This EUA will remain  in effect (meaning this test can be used) for the duration of the COVID-19 declaration under Section 564(b)(1) of the Act, 21 U.S.C.section 360bbb-3(b)(1), unless the authorization is terminated  or revoked sooner.       Influenza A by PCR NEGATIVE NEGATIVE Final  Influenza B by PCR NEGATIVE NEGATIVE Final    Comment: (NOTE) The Xpert Xpress SARS-CoV-2/FLU/RSV plus assay is intended as an aid in the  diagnosis of influenza from Nasopharyngeal swab specimens and should not be used as a sole basis for treatment. Nasal washings and aspirates are unacceptable for Xpert Xpress SARS-CoV-2/FLU/RSV testing.  Fact Sheet for Patients: BloggerCourse.comhttps://www.fda.gov/media/152166/download  Fact Sheet for Healthcare Providers: SeriousBroker.ithttps://www.fda.gov/media/152162/download  This test is not yet approved or cleared by the [place] FDA and has been authorized for detection and/or diagnosis of SARS-CoV-2 by FDA under an Emergency Use Authorization (EUA). This EUA will remain in effect (meaning this test can be used) for the duration of the COVID-19 declaration under Section 564(b)(1) of the Act, 21 U.S.C. section 360bbb-3(b)(1), unless the authorization is terminated or revoked.  Performed at [facility], [address] [place], [place] [address]   Culture, blood (routine x 2)     Status: Abnormal (Preliminary result)   Collection Time: 07/04/20  6:13 AM   Specimen: BLOOD  Result Value Ref Range Status   Specimen Description BLOOD RIGHT ANTECUBITAL  Final   Special Requests   Final    BOTTLES DRAWN AEROBIC AND ANAEROBIC Blood Culture adequate volume   Culture  Setup Time   Final    GRAM POSITIVE COCCI IN CLUSTERS IN BOTH AEROBIC AND ANAEROBIC BOTTLES CRITICAL RESULT CALLED TO, READ BACK BY AND VERIFIED WITH: Melven SartoriusJ LEDFORD [facility] 07/04/20 2316 JDW Performed at [facility], [address] [place], [place] [address]    Culture STAPHYLOCOCCUS AUREUS (A)  Final   Report Status PENDING  Incomplete   Organism ID, Bacteria STAPHYLOCOCCUS AUREUS  Final      Susceptibility   Staphylococcus aureus - MIC*    CIPROFLOXACIN <=0.5 SENSITIVE Sensitive     ERYTHROMYCIN >=8 RESISTANT Resistant     GENTAMICIN <=0.5 SENSITIVE Sensitive     OXACILLIN <=0.25 SENSITIVE Sensitive     TETRACYCLINE <=1 SENSITIVE Sensitive     VANCOMYCIN <=0.5 SENSITIVE Sensitive     TRIMETH/SULFA <=10 SENSITIVE Sensitive      CLINDAMYCIN RESISTANT Resistant     RIFAMPIN <=0.5 SENSITIVE Sensitive     Inducible Clindamycin POSITIVE Resistant     * STAPHYLOCOCCUS AUREUS  Blood Culture ID Panel (Reflexed)     Status: Abnormal   Collection Time: 07/04/20  6:13 AM  Result Value Ref Range Status   Enterococcus faecalis NOT DETECTED NOT DETECTED Final   Enterococcus Faecium NOT DETECTED NOT DETECTED Final   Listeria monocytogenes NOT DETECTED NOT DETECTED Final   Staphylococcus species DETECTED (A) NOT DETECTED Final    Comment: CRITICAL RESULT CALLED TO, READ BACK BY AND VERIFIED WITH: J LEDFORD PHARMD 07/04/20 2316 JDW    Staphylococcus aureus (BCID) DETECTED (A) NOT DETECTED Final    Comment: Methicillin (oxacillin) susceptible Staphylococcus aureus (MSSA). Preferred therapy is anti staphylococcal beta lactam antibiotic (Cefazolin or Nafcillin), unless clinically contraindicated. CRITICAL RESULT CALLED TO, READ BACK BY AND VERIFIED WITH: J LEDFORD [facility] 07/04/20 2316 JDW    Staphylococcus epidermidis DETECTED (A) NOT DETECTED Final    Comment: CRITICAL RESULT CALLED TO, READ BACK BY AND VERIFIED WITH: J LEDFORD PHARMD 07/04/20 2316 JDW    Staphylococcus lugdunensis NOT DETECTED NOT DETECTED Final   Streptococcus species DETECTED (A) NOT DETECTED Final    Comment: Not Enterococcus species, Streptococcus agalactiae, Streptococcus pyogenes, or Streptococcus pneumoniae. CRITICAL RESULT CALLED TO, READ BACK BY AND VERIFIED WITH: J LEDFORD [facility] 07/04/20 2316 JDW  Streptococcus agalactiae NOT DETECTED NOT DETECTED Final   Streptococcus pneumoniae NOT DETECTED NOT DETECTED Final   Streptococcus pyogenes NOT DETECTED NOT DETECTED Final   A.calcoaceticus-baumannii NOT DETECTED NOT DETECTED Final   Bacteroides fragilis NOT DETECTED NOT DETECTED Final   Enterobacterales NOT DETECTED NOT DETECTED Final   Enterobacter cloacae complex NOT DETECTED NOT DETECTED Final   Escherichia coli NOT DETECTED NOT DETECTED Final    Klebsiella aerogenes NOT DETECTED NOT DETECTED Final   Klebsiella oxytoca NOT DETECTED NOT DETECTED Final   Klebsiella pneumoniae NOT DETECTED NOT DETECTED Final   Proteus species NOT DETECTED NOT DETECTED Final   Salmonella species NOT DETECTED NOT DETECTED Final   Serratia marcescens NOT DETECTED NOT DETECTED Final   Haemophilus influenzae NOT DETECTED NOT DETECTED Final   Neisseria meningitidis NOT DETECTED NOT DETECTED Final   Pseudomonas aeruginosa NOT DETECTED NOT DETECTED Final   Stenotrophomonas maltophilia NOT DETECTED NOT DETECTED Final   Candida albicans NOT DETECTED NOT DETECTED Final   Candida auris NOT DETECTED NOT DETECTED Final   Candida glabrata NOT DETECTED NOT DETECTED Final   Candida krusei NOT DETECTED NOT DETECTED Final   Candida parapsilosis NOT DETECTED NOT DETECTED Final   Candida tropicalis NOT DETECTED NOT DETECTED Final   Cryptococcus neoformans/gattii NOT DETECTED NOT DETECTED Final   Methicillin resistance mecA/C NOT DETECTED NOT DETECTED Final   Meth resistant mecA/C and MREJ NOT DETECTED NOT DETECTED Final    Comment: Performed at [facility], [address] [place], [place] [address]  Culture, blood (routine x 2)     Status: None (Preliminary result)   Collection Time: 07/04/20  7:55 AM   Specimen: BLOOD RIGHT HAND  Result Value Ref Range Status   Specimen Description BLOOD RIGHT HAND  Final   Special Requests   Final    BOTTLES DRAWN AEROBIC AND ANAEROBIC Blood Culture adequate volume   Culture   Final    NO GROWTH 3 DAYS Performed at [facility], [address] [place], [place] [address]    Report Status PENDING  Incomplete  Culture, blood (routine x 2)     Status: None (Preliminary result)   Collection Time: 07/06/20  4:26 AM   Specimen: BLOOD  Result Value Ref Range Status   Specimen Description BLOOD RIGHT ANTECUBITAL  Final   Special Requests AEROBIC BOTTLE ONLY Blood Culture adequate volume  Final   Culture   Final    NO  GROWTH 1 DAY Performed at [facility], [address] [place], [place] [address]    Report Status PENDING  Incomplete  Culture, blood (routine x 2)     Status: None (Preliminary result)   Collection Time: 07/06/20  4:29 AM   Specimen: BLOOD  Result Value Ref Range Status   Specimen Description BLOOD LEFT ANTECUBITAL  Final   Special Requests AEROBIC BOTTLE ONLY Blood Culture adequate volume  Final   Culture   Final    NO GROWTH 1 DAY Performed at [facility], [address], [place], [place] [address]    Report Status PENDING  Incomplete    Pertinent Lab. CBC Latest Ref Rng & Units 07/09/2020 07/07/2020 07/05/2020  WBC 4.0 - 10.5 K/uL 10.4 9.8 15.4(H)  Hemoglobin 13.0 - 17.0 g/dL 10.1(L) 9.1(L) 11.8(L)  Hematocrit 39.0 - 52.0 % 29.2(L) 27.8(L) 34.0(L)  Platelets 150 - 400 K/uL 132(L) 115(L) 122(L)   CMP Latest Ref Rng & Units 07/08/2020 07/08/2020 07/07/2020  Glucose 70 -  99 mg/dL 413(K) 93 96  BUN 6 - 20 mg/dL 18 44(W) 10(U)  Creatinine 0.61 - 1.24 mg/dL 7.25(D) 6.64(Q) 0.34(V)  Sodium 135 - 145 mmol/L 142 139 141  Potassium 3.5 - 5.1 mmol/L 4.1 3.6 3.4(L)  Chloride 98 - 111 mmol/L 108 108 110  CO2 22 - 32 mmol/L 23 24 22   Calcium 8.9 - 10.3 mg/dL ) 4.2(V) 9.5(G)  Total Protein 6.5 - 8.1 g/dL 5.2(L) - 5.1(L)  Total Bilirubin 0.3 - 1.2 mg/dL 1.1 - 1.1  Alkaline Phos 38 - 126 U/L 57 - 62  AST 15 - 41 U/L 205(H) - 398(H)  ALT 0 - 44 U/L 79(H) - 173(H)    Pertinent Imaging today Plain films and CT images have been personally visualized and interpreted; radiology reports have been reviewed. Decision making incorporated into the Impression / Recommendations.  I have spent approx 30 minutes for this patient encounter including review of prior medical records with greater than 50% of time being face to face and coordination of their care.  Electronically signed by:   3.8(V, MD Infectious Disease Physician [facility] for Infectious  Disease Pager: [phone]

## 2020-07-09 DIAGNOSIS — G9340 Encephalopathy, unspecified: Secondary | ICD-10-CM | POA: Diagnosis not present

## 2020-07-09 LAB — COMPREHENSIVE METABOLIC PANEL
ALT: 57 U/L — ABNORMAL HIGH (ref 0–44)
AST: 141 U/L — ABNORMAL HIGH (ref 15–41)
Albumin: 3 g/dL — ABNORMAL LOW (ref 3.5–5.0)
Alkaline Phosphatase: 53 U/L (ref 38–126)
Anion gap: 11 (ref 5–15)
BUN: 14 mg/dL (ref 6–20)
CO2: 24 mmol/L (ref 22–32)
Calcium: 8.9 mg/dL (ref 8.9–10.3)
Chloride: 106 mmol/L (ref 98–111)
Creatinine, Ser: 1.28 mg/dL — ABNORMAL HIGH (ref 0.61–1.24)
GFR, Estimated: >60 mL/min (ref >60)
Glucose, Bld: 99 mg/dL (ref 70–99)
Potassium: 4.3 mmol/L (ref 3.5–5.1)
Sodium: 141 mmol/L (ref 135–145)
Total Bilirubin: 0.7 mg/dL (ref 0.3–1.2)
Total Protein: 5.8 g/dL — ABNORMAL LOW (ref 6.5–8.1)

## 2020-07-09 LAB — CBC
HCT: 29.2 % — ABNORMAL LOW (ref 39.0–52.0)
Hemoglobin: 10.1 g/dL — ABNORMAL LOW (ref 13.0–17.0)
MCH: 31 pg (ref 26.0–34.0)
MCHC: 34.6 g/dL (ref 30.0–36.0)
MCV: 89.6 fL (ref 80.0–100.0)
Platelets: 132 10*3/uL — ABNORMAL LOW (ref 150–400)
RBC: 3.26 MIL/uL — ABNORMAL LOW (ref 4.22–5.81)
RDW: 13.7 % (ref 11.5–15.5)
WBC: 10.4 10*3/uL (ref 4.0–10.5)
nRBC: 0 % (ref 0.0–0.2)

## 2020-07-09 LAB — CULTURE, BLOOD (ROUTINE X 2)
Culture: NO GROWTH
Special Requests: ADEQUATE
Special Requests: ADEQUATE

## 2020-07-09 MED ORDER — ZOLPIDEM TARTRATE 5 MG PO TABS
5.0000 mg | ORAL_TABLET | Freq: Once | ORAL | Status: AC
  Administered 2020-07-09: 5 mg via ORAL
  Filled 2020-07-09: qty 1

## 2020-07-09 NOTE — Progress Notes (Signed)
PROGRESS NOTE    Nox Talent   ZOX:[id number]  DOB: 03-31-1991  DOA: 07/04/2020 PCP: Pcp, No   Brief Narrative:  Zachary Hurst is a 30 year old male with a history of heroin and cocaine abuse who was brought to the hospital by EMS with severe acute confusion his girlfriend called them.  According to the girlfriend the patient had been using heroin and cocaine that day and subsequently became extremely aggressive yelling and screaming and flailing his extremities. Further history obtained from the patient's mother.  She states that the patient has been in a drug rehab center for 2 months and was released on Friday on a pass for the weekend.  He visited his mother on Saturday who suspected that he may be high but did not notice any track marks at that time. The patient's mother further states that about 2 years ago the patient was admitted to [facility] after a similar episode of severe agitation when he used heroin.  She states that IV Ativan was ineffective in reducing his agitation and he was eventually placed in a medically induced coma.  He recuperated and was discharged within 6 days of being admitted.  He has been admitted to an inpatient rehab facility on 3 separate occasions but has relapsed each time.  Given Geodon in the ED for his severe agitation and this helped him.  Subjective: Awake and coherent today. No complaints.     Assessment & Plan:   Principal Problem:   Acute encephalopathy - multifactorial-he currently has bacteremia with staph and strep, AKI, and has used heroin which may have been mixed with fentanyl and other substances along with cocaine a few days ago - has resolved- very oriented now, out of restraints  Active Problems:  Staph and streptococcal bacteremia with leukocytosis -Likely related to IV drug abuse-he also snorts cocaine and may have some breakdown of the nasal mucosa-he also has numerous scabs on his feet and legs which may be entry points  for bacteria -Continue cefazolin -2D echo reveals no vegetations-he will need a TEE but was declining this- today he is agreeable and, thus, I will notify cardiology -Appreciate ID consult  Drug abuse -With IV drugs and snorting drugs -Mother states that does not have any more money to pay for inpatient rehab facilities that she has paid for these facilities on 3 different occasions -He is a candidate for Suboxone or methadone and hopefully will be able to find a clinic for him to go to once he is closer to discharge  Rhabdomyolysis with AKI and metabolic acidosis -Suspect his AKI is related to rhabdomyolysis and some dehydration -His rhabdomyolysis may be related to the severe agitation and flailing of extremities noted by his girlfriend who sent a video of this to his mother-his mother feels that this is possibly related to fentanyl mixed in with the heroin that he was using -CK levels and Cr now improved-     Elevated LFTs -Likely related to sepsis and rhabdomyolysis related -his mom states that he does not drink alcohol -AST 1085 and ALT 443- also improving    Hepatitis C antibody positive in blood  Thrombocytopenia - acute in the hospital- cont to follow- plt were 115 yesterday- follow    Time spent in minutes: 35-  DVT prophylaxis: SCDs Start: 07/04/20 0224 Code Status: Full code Family Communication: Mother Level of Care: Level of care: Progressive- downgrade to med/surg Disposition Plan:  Status is: Inpatient  Remains inpatient appropriate because:IV treatments appropriate due to  intensity of illness or inability to take PO   Dispo: The patient is from: Home              Anticipated d/c is to: Home              Anticipated d/c date is: > 3 days              Patient currently is not medically stable to d/c.   Difficult to place patient No  Consultants:   ID  Nephrology  Procedures:   2D echo Antimicrobials:  Anti-infectives (From admission, onward)   Start      Dose/Rate Route Frequency Ordered Stop   07/07/20 2000  ceFAZolin (ANCEF) IVPB 2g/100 mL premix        2 g 200 mL/hr over 30 Minutes Intravenous Every 8 hours 07/07/20 1617     07/04/20 2345  ceFAZolin (ANCEF) IVPB 2g/100 mL premix  Status:  Discontinued        2 g 200 mL/hr over 30 Minutes Intravenous Every 12 hours 07/04/20 2330 07/07/20 1617   07/04/20 2330  ceFAZolin (ANCEF) IVPB 2g/100 mL premix  Status:  Discontinued        2 g 200 mL/hr over 30 Minutes Intravenous Every 8 hours 07/04/20 2328 07/04/20 2329       Objective: Vitals:   07/08/20 1948 07/08/20 2344 07/09/20 0358 07/09/20 0852  BP: (!) 157/98 (!) 153/82 (!) 165/92 132/90  Pulse: 92 79 81 (!) 109  Resp: 17  18 18   Temp: 98.6 F (37 C) 99.1 F (37.3 C) 98.5 F (36.9 C)   TempSrc: Oral Oral Oral   SpO2: 100% 100% 99% 100%  Weight:      Height:        Intake/Output Summary (Last 24 hours) at 07/09/2020 1036 Last data filed at 07/09/2020 0719 Gross per 24 hour  Intake 2429.84 ml  Output 4120 ml  Net -1690.16 ml   Filed Weights   07/05/20 0800 07/06/20 0600  Weight: 95 kg 96.3 kg    Examination: General exam: Appears comfortable  HEENT: PERRLA, oral mucosa moist, no sclera icterus or thrush Respiratory system: Clear to auscultation. Respiratory effort normal. Cardiovascular system: S1 & S2 heard, regular rate and rhythm Gastrointestinal system: Abdomen soft, non-tender, nondistended. Normal bowel sounds   Central nervous system: Alert and oriented. No focal neurological deficits. Extremities: No cyanosis, clubbing or edema Skin: No rashes or ulcers Psychiatry:  Mood & affect appropriate.   Data Reviewed: I have personally reviewed following labs and imaging studies  CBC: Recent Labs  Lab 07/04/20 0113 07/04/20 0251 07/05/20 0412 07/07/20 0805 07/09/20 0457  WBC 25.5* 24.5* 15.4* 9.8 10.4  NEUTROABS 22.5* 20.7* 13.5*  --   --   HGB 14.3 14.5 11.8* 9.1* 10.1*  HCT 40.1 40.7 34.0* 27.8* 29.2*   MCV 86.1 86.8 89.9 90.8 89.6  PLT 203 195 122* 115* 132*   Basic Metabolic Panel: Recent Labs  Lab 07/04/20 1458 07/05/20 0412 07/05/20 1854 07/06/20 0421 07/07/20 0805 07/08/20 0329 07/08/20 1648  NA  --    < > 140 143 141 139 142  K  --    < > 4.3 4.0 3.4* 3.6 4.1  CL  --    < > 108 112* 110 108 108  CO2  --    < > 20* 20* 22 24 23   GLUCOSE  --    < > 90 95 96 93 111*  BUN  --    < >  85* 75* 37* 25* 18  CREATININE  --    < > 3.90* 3.71* 1.92* 1.55* 1.45*  CALCIUM  --    < > 7.5* 7.9* 8.0* 8.5* 8.7*  MG 2.8*  --   --   --   --   --   --   PHOS 6.9*  --   --   --   --  2.9  --    < > = values in this interval not displayed.   GFR: Estimated Creatinine Clearance: 91.1 mL/min (A) (by C-G formula based on SCr of 1.45 mg/dL (H)). Liver Function Tests: Recent Labs  Lab 07/04/20 0251 07/04/20 1458 07/05/20 0412 07/07/20 0805 07/08/20 0329 07/08/20 1648  AST 928* 1,085* 1,100* 398*  --  205*  ALT 384* 443* 525* 173*  --  79*  ALKPHOS 83 63 69 62  --  57  BILITOT 3.0* 1.6* 1.3* 1.1  --  1.1  PROT 7.8 6.3* 5.6* 5.1*  --  5.2*  ALBUMIN 4.7 3.7 3.2* 2.7* 2.7* 2.8*   No results for input(s): LIPASE, AMYLASE in the last 168 hours. No results for input(s): AMMONIA in the last 168 hours. Coagulation Profile: Recent Labs  Lab 07/04/20 0639 07/05/20 0412  INR 1.3* 1.3*   Cardiac Enzymes: Recent Labs  Lab 07/05/20 0412 07/06/20 0421 07/07/20 0352 07/07/20 0805 07/08/20 0329  CKTOTAL 40,981* 19,147* 6,094* 4,884* 2,903*   BNP (last 3 results) No results for input(s): PROBNP in the last 8760 hours. HbA1C: No results for input(s): HGBA1C in the last 72 hours. CBG: Recent Labs  Lab 07/07/20 1627 07/07/20 2346 07/08/20 0631 07/08/20 1253 07/08/20 1612  GLUCAP 101* 82 119* 90 98   Lipid Profile: No results for input(s): CHOL, HDL, LDLCALC, TRIG, CHOLHDL, LDLDIRECT in the last 72 hours. Thyroid Function Tests: No results for input(s): TSH, T4TOTAL, FREET4,  T3FREE, THYROIDAB in the last 72 hours. Anemia Panel: No results for input(s): VITAMINB12, FOLATE, FERRITIN, TIBC, IRON, RETICCTPCT in the last 72 hours. Urine analysis:    Component Value Date/Time   COLORURINE YELLOW 07/05/2020 1400   APPEARANCEUR HAZY (A) 07/05/2020 1400   LABSPEC 1.010 07/05/2020 1400   PHURINE 5.0 07/05/2020 1400   GLUCOSEU NEGATIVE 07/05/2020 1400   HGBUR LARGE (A) 07/05/2020 1400   BILIRUBINUR NEGATIVE 07/05/2020 1400   KETONESUR 5 (A) 07/05/2020 1400   PROTEINUR 30 (A) 07/05/2020 1400   NITRITE NEGATIVE 07/05/2020 1400   LEUKOCYTESUR NEGATIVE 07/05/2020 1400   Sepsis Labs: (procalcitonin:4,lacticidven:4) ) Recent Results (from the past 240 hour(s))  Resp Panel by RT-PCR (Flu A&B, Covid) Nasopharyngeal Swab     Status: None   Collection Time: 07/04/20  3:08 AM   Specimen: Nasopharyngeal Swab; Nasopharyngeal(NP) swabs in vial transport medium  Result Value Ref Range Status   SARS Coronavirus 2 by RT PCR NEGATIVE NEGATIVE Final    Comment: (NOTE) SARS-CoV-2 target nucleic acids are NOT DETECTED.  The SARS-CoV-2 RNA is generally detectable in upper respiratory specimens during the acute phase of infection. The lowest concentration of SARS-CoV-2 viral copies this assay can detect is 138 copies/mL. A negative result does not preclude SARS-Cov-2 infection and should not be used as the sole basis for treatment or other patient management decisions. A negative result may occur with  improper specimen collection/handling, submission of specimen other than nasopharyngeal swab, presence of viral mutation(s) within the areas targeted by this assay, and inadequate number of viral copies(<138 copies/mL). A negative result must be combined with clinical observations, patient  history, and epidemiological information. The expected result is Negative.  Fact Sheet for Patients:  BloggerCourse.com  Fact Sheet for Healthcare  Providers:  SeriousBroker.it  This test is no t yet approved or cleared by the [place] FDA and  has been authorized for detection and/or diagnosis of SARS-CoV-2 by FDA under an Emergency Use Authorization (EUA). This EUA will remain  in effect (meaning this test can be used) for the duration of the COVID-19 declaration under Section 564(b)(1) of the Act, 21 U.S.C.section 360bbb-3(b)(1), unless the authorization is terminated  or revoked sooner.       Influenza A by PCR NEGATIVE NEGATIVE Final   Influenza B by PCR NEGATIVE NEGATIVE Final    Comment: (NOTE) The Xpert Xpress SARS-CoV-2/FLU/RSV plus assay is intended as an aid in the diagnosis of influenza from Nasopharyngeal swab specimens and should not be used as a sole basis for treatment. Nasal washings and aspirates are unacceptable for Xpert Xpress SARS-CoV-2/FLU/RSV testing.  Fact Sheet for Patients: BloggerCourse.com  Fact Sheet for Healthcare Providers: SeriousBroker.it  This test is not yet approved or cleared by the [place] FDA and has been authorized for detection and/or diagnosis of SARS-CoV-2 by FDA under an Emergency Use Authorization (EUA). This EUA will remain in effect (meaning this test can be used) for the duration of the COVID-19 declaration under Section 564(b)(1) of the Act, 21 U.S.C. section 360bbb-3(b)(1), unless the authorization is terminated or revoked.  Performed at [facility], [address] [place], [place] [address]   Culture, blood (routine x 2)     Status: Abnormal   Collection Time: 07/04/20  6:13 AM   Specimen: BLOOD  Result Value Ref Range Status   Specimen Description BLOOD RIGHT ANTECUBITAL  Final   Special Requests   Final    BOTTLES DRAWN AEROBIC AND ANAEROBIC Blood Culture adequate volume   Culture  Setup Time   Final    GRAM POSITIVE COCCI IN CLUSTERS IN BOTH AEROBIC AND ANAEROBIC  BOTTLES CRITICAL RESULT CALLED TO, READ BACK BY AND VERIFIED WITH: Melven Sartorius [facility] 07/04/20 2316 JDW Performed at [facility], [address], [place], [place] [address]    Culture STAPHYLOCOCCUS AUREUS (A)  Final   Report Status 07/09/2020 FINAL  Final   Organism ID, Bacteria STAPHYLOCOCCUS AUREUS  Final      Susceptibility   Staphylococcus aureus - MIC*    CIPROFLOXACIN <=0.5 SENSITIVE Sensitive     ERYTHROMYCIN >=8 RESISTANT Resistant     GENTAMICIN <=0.5 SENSITIVE Sensitive     OXACILLIN <=0.25 SENSITIVE Sensitive     TETRACYCLINE <=1 SENSITIVE Sensitive     VANCOMYCIN <=0.5 SENSITIVE Sensitive     TRIMETH/SULFA <=10 SENSITIVE Sensitive     CLINDAMYCIN RESISTANT Resistant     RIFAMPIN <=0.5 SENSITIVE Sensitive     Inducible Clindamycin POSITIVE Resistant     * STAPHYLOCOCCUS AUREUS  Blood Culture ID Panel (Reflexed)     Status: Abnormal   Collection Time: 07/04/20  6:13 AM  Result Value Ref Range Status   Enterococcus faecalis NOT DETECTED NOT DETECTED Final   Enterococcus Faecium NOT DETECTED NOT DETECTED Final   Listeria monocytogenes NOT DETECTED NOT DETECTED Final   Staphylococcus species DETECTED (A) NOT DETECTED Final    Comment: CRITICAL RESULT CALLED TO, READ BACK BY AND VERIFIED WITH: J LEDFORD PHARMD 07/04/20 2316 JDW    Staphylococcus aureus (BCID) DETECTED (A) NOT DETECTED Final    Comment: Methicillin (oxacillin) susceptible Staphylococcus aureus (MSSA). Preferred  therapy is anti staphylococcal beta lactam antibiotic (Cefazolin or Nafcillin), unless clinically contraindicated. CRITICAL RESULT CALLED TO, READ BACK BY AND VERIFIED WITH: J LEDFORD [facility] 07/04/20 2316 JDW    Staphylococcus epidermidis DETECTED (A) NOT DETECTED Final    Comment: CRITICAL RESULT CALLED TO, READ BACK BY AND VERIFIED WITH: J LEDFORD PHARMD 07/04/20 2316 JDW    Staphylococcus lugdunensis NOT DETECTED NOT DETECTED Final   Streptococcus species DETECTED (A) NOT DETECTED Final     Comment: Not Enterococcus species, Streptococcus agalactiae, Streptococcus pyogenes, or Streptococcus pneumoniae. CRITICAL RESULT CALLED TO, READ BACK BY AND VERIFIED WITH: J LEDFORD PHARMD 07/04/20 2316 JDW    Streptococcus agalactiae NOT DETECTED NOT DETECTED Final   Streptococcus pneumoniae NOT DETECTED NOT DETECTED Final   Streptococcus pyogenes NOT DETECTED NOT DETECTED Final   A.calcoaceticus-baumannii NOT DETECTED NOT DETECTED Final   Bacteroides fragilis NOT DETECTED NOT DETECTED Final   Enterobacterales NOT DETECTED NOT DETECTED Final   Enterobacter cloacae complex NOT DETECTED NOT DETECTED Final   Escherichia coli NOT DETECTED NOT DETECTED Final   Klebsiella aerogenes NOT DETECTED NOT DETECTED Final   Klebsiella oxytoca NOT DETECTED NOT DETECTED Final   Klebsiella pneumoniae NOT DETECTED NOT DETECTED Final   Proteus species NOT DETECTED NOT DETECTED Final   Salmonella species NOT DETECTED NOT DETECTED Final   Serratia marcescens NOT DETECTED NOT DETECTED Final   Haemophilus influenzae NOT DETECTED NOT DETECTED Final   Neisseria meningitidis NOT DETECTED NOT DETECTED Final   Pseudomonas aeruginosa NOT DETECTED NOT DETECTED Final   Stenotrophomonas maltophilia NOT DETECTED NOT DETECTED Final   Candida albicans NOT DETECTED NOT DETECTED Final   Candida auris NOT DETECTED NOT DETECTED Final   Candida glabrata NOT DETECTED NOT DETECTED Final   Candida krusei NOT DETECTED NOT DETECTED Final   Candida parapsilosis NOT DETECTED NOT DETECTED Final   Candida tropicalis NOT DETECTED NOT DETECTED Final   Cryptococcus neoformans/gattii NOT DETECTED NOT DETECTED Final   Methicillin resistance mecA/C NOT DETECTED NOT DETECTED Final   Meth resistant mecA/C and MREJ NOT DETECTED NOT DETECTED Final    Comment: Performed at [facility], [address] [place], [place] [address]  Culture, blood (routine x 2)     Status: None   Collection Time: 07/04/20  7:55 AM   Specimen: BLOOD RIGHT  HAND  Result Value Ref Range Status   Specimen Description BLOOD RIGHT HAND  Final   Special Requests   Final    BOTTLES DRAWN AEROBIC AND ANAEROBIC Blood Culture adequate volume   Culture   Final    NO GROWTH 5 DAYS Performed at [facility], [address], [place], [place] [address]    Report Status 07/09/2020 FINAL  Final  Culture, blood (routine x 2)     Status: None (Preliminary result)   Collection Time: 07/06/20  4:26 AM   Specimen: BLOOD  Result Value Ref Range Status   Specimen Description BLOOD RIGHT ANTECUBITAL  Final   Special Requests AEROBIC BOTTLE ONLY Blood Culture adequate volume  Final   Culture   Final    NO GROWTH 3 DAYS Performed at [facility], [address], [place], [place] [address]    Report Status PENDING  Incomplete  Culture, blood (routine x 2)     Status: None (Preliminary result)   Collection Time: 07/06/20  4:29 AM   Specimen: BLOOD  Result Value Ref Range Status   Specimen Description BLOOD LEFT ANTECUBITAL  Final  Special Requests AEROBIC BOTTLE ONLY Blood Culture adequate volume  Final   Culture   Final    NO GROWTH 3 DAYS Performed at [facility], [address] [place], [place] [address]    Report Status PENDING  Incomplete         Radiology Studies: No results found.    Scheduled Meds: . folic acid  1 mg Oral Daily  . melatonin  5 mg Oral QHS  . multivitamin with minerals  1 tablet Oral Daily  . nicotine  21 mg Transdermal Daily  . thiamine  100 mg Oral Daily   Or  . thiamine  100 mg Intravenous Daily   Continuous Infusions: . sodium chloride 10 mL/hr at 07/09/20 0531  .  ceFAZolin (ANCEF) IV 2 g (07/09/20 0533)     LOS: 5 days      Calvert Cantor, MD [facility] Pager: [email or web address] 07/09/2020, 10:36 AM

## 2020-07-09 NOTE — Progress Notes (Signed)
   Message sent and patient made n.p.o. starting Monday for possible TEE.  Theodore Demark, PA-C 07/09/2020 10:49 AM

## 2020-07-09 NOTE — Plan of Care (Signed)

## 2020-07-10 ENCOUNTER — Inpatient Hospital Stay (HOSPITAL_COMMUNITY): Payer: BC Managed Care – PPO

## 2020-07-10 DIAGNOSIS — G9340 Encephalopathy, unspecified: Secondary | ICD-10-CM | POA: Diagnosis not present

## 2020-07-10 DIAGNOSIS — M7989 Other specified soft tissue disorders: Secondary | ICD-10-CM

## 2020-07-10 LAB — COMPREHENSIVE METABOLIC PANEL
ALT: 37 U/L (ref 0–44)
AST: 71 U/L — ABNORMAL HIGH (ref 15–41)
Albumin: 3.1 g/dL — ABNORMAL LOW (ref 3.5–5.0)
Alkaline Phosphatase: 49 U/L (ref 38–126)
Anion gap: 7 (ref 5–15)
BUN: 13 mg/dL (ref 6–20)
CO2: 28 mmol/L (ref 22–32)
Calcium: 8.9 mg/dL (ref 8.9–10.3)
Chloride: 106 mmol/L (ref 98–111)
Creatinine, Ser: 1.18 mg/dL (ref 0.61–1.24)
GFR, Estimated: >60 mL/min (ref >60)
Glucose, Bld: 112 mg/dL — ABNORMAL HIGH (ref 70–99)
Potassium: 4.2 mmol/L (ref 3.5–5.1)
Sodium: 141 mmol/L (ref 135–145)
Total Bilirubin: 0.5 mg/dL (ref 0.3–1.2)
Total Protein: 5.8 g/dL — ABNORMAL LOW (ref 6.5–8.1)

## 2020-07-10 MED ORDER — ZOLPIDEM TARTRATE 5 MG PO TABS
2.5000 mg | ORAL_TABLET | Freq: Once | ORAL | Status: AC
  Administered 2020-07-10: 2.5 mg via ORAL
  Filled 2020-07-10: qty 1

## 2020-07-10 MED ORDER — ALUM & MAG HYDROXIDE-SIMETH 200-200-20 MG/5ML PO SUSP
30.0000 mL | Freq: Four times a day (QID) | ORAL | Status: DC | PRN
  Administered 2020-07-10: 30 mL via ORAL
  Filled 2020-07-10: qty 30

## 2020-07-10 NOTE — Progress Notes (Signed)
Right upper extremity venous duplex has been completed. Preliminary results can be found in CV Proc through chart review.  Results were given to the patient's nurse, Sian.  07/10/20 3:26 PM Olen Cordial RVT

## 2020-07-10 NOTE — Progress Notes (Addendum)
PROGRESS NOTE    [name]   WUJ:[id number]  DOB: [birth year]  DOA: 07/04/2020 PCP: Pcp, No   Brief Narrative:  Zachary Hurst is a 30 year old male with a history of heroin and cocaine abuse who was brought to the hospital by EMS with severe acute confusion his girlfriend called them.  According to the girlfriend the patient had been using heroin and cocaine that day and subsequently became extremely aggressive yelling and screaming and flailing his extremities. Further history obtained from the patient's mother.  She states that the patient has been in a drug rehab center for 2 months and was released on Friday on a pass for the weekend.  He visited his mother on Saturday who suspected that he may be high but did not notice any track marks at that time. The patient's mother further states that about 2 years ago the patient was admitted to [facility] after a similar episode of severe agitation when he used heroin.  She states that IV Ativan was ineffective in reducing his agitation and he was eventually placed in a medically induced coma.  He recuperated and was discharged within 6 days of being admitted.  He has been admitted to an inpatient rehab facility on 3 separate occasions but has relapsed each time.  Given Geodon in the ED for his severe agitation and this helped him.  Subjective: He has no complaints today.     Assessment & Plan:   Principal Problem:   Acute encephalopathy - multifactorial-he currently has bacteremia with staph and strep, AKI, and has used heroin which may have been mixed with fentanyl and other substances along with cocaine a few days ago - has resolved- very oriented now, out of restraints and able to understand and comply with the plan of treatment  Active Problems:  Staph bacteremia with leukocytosis -Likely related to IV drug abuse-he also snorts cocaine and may have some breakdown of the nasal mucosa-he also has numerous scabs on his feet and  legs which may be entry points for bacteria -Continue cefazolin -2D echo reveals no vegetations  -Appreciate ID consult-  - repeat cultures are negative - we are awaiting a TEE to make a final decision on duration of antibiotics   Right arm swelling - swelling of anti cubital area, arm and forearm with tenderness- ? If IV infiltrated- will obtain a venous duplex to r/u DVT.  Drug abuse -With IV drugs and snorting drugs -Mother states that does not have any more money to pay for inpatient rehab facilities that she has paid for these facilities on 3 different occasions -He is a candidate for Suboxone or methadone and hopefully will be able to find a clinic for him to go to once he is closer to discharge  Rhabdomyolysis with AKI and metabolic acidosis -Suspect his AKI is related to rhabdomyolysis and some dehydration -His rhabdomyolysis may be related to the severe agitation and flailing of extremities noted by his girlfriend who sent a video of this to his mother-his mother feels that this is possibly related to fentanyl mixed in with the heroin that he was using -CK levels and Cr now improved-     Elevated LFTs -Likely related to sepsis and rhabdomyolysis related -his mom states that he does not drink alcohol -AST 1085 and ALT 443- also improving    Hepatitis C antibody positive in blood  Thrombocytopenia- ? Due to infection - acute in the hospital- platelets steadily improving-    Time spent in minutes: 35-  DVT prophylaxis: SCDs Start: 07/04/20 0224 Code Status: Full code Family Communication: Mother Level of Care: Level of care: Med-Surg- downgrade to med/surg Disposition Plan:  Status is: Inpatient  Remains inpatient appropriate because:IV treatments appropriate due to intensity of illness or inability to take PO   Dispo: The patient is from: Home              Anticipated d/c is to: Home              Anticipated d/c date is: > 3 days              Patient currently is  not medically stable to d/c.   Difficult to place patient No  Consultants:   ID  Nephrology  Procedures:   2D echo Antimicrobials:  Anti-infectives (From admission, onward)   Start     Dose/Rate Route Frequency Ordered Stop   07/07/20 2000  ceFAZolin (ANCEF) IVPB 2g/100 mL premix        2 g 200 mL/hr over 30 Minutes Intravenous Every 8 hours 07/07/20 1617     07/04/20 2345  ceFAZolin (ANCEF) IVPB 2g/100 mL premix  Status:  Discontinued        2 g 200 mL/hr over 30 Minutes Intravenous Every 12 hours 07/04/20 2330 07/07/20 1617   07/04/20 2330  ceFAZolin (ANCEF) IVPB 2g/100 mL premix  Status:  Discontinued        2 g 200 mL/hr over 30 Minutes Intravenous Every 8 hours 07/04/20 2328 07/04/20 2329       Objective: Vitals:   07/09/20 2009 07/10/20 0436 07/10/20 0737 07/10/20 1138  BP: (!) 145/90 134/79 127/74 (!) 153/91  Pulse: (!) 103 69 67 90  Resp: 18 18 16 18   Temp: 97.8 F (36.6 C) 98.1 F (36.7 C) 98 F (36.7 C) 98 F (36.7 C)  TempSrc: Oral Oral Oral Oral  SpO2: 100% 98% 100% 100%  Weight:      Height:        Intake/Output Summary (Last 24 hours) at 07/10/2020 1330 Last data filed at 07/10/2020 0700 Gross per 24 hour  Intake 860.41 ml  Output --  Net 860.41 ml   Filed Weights   07/05/20 0800 07/06/20 0600  Weight: 95 kg 96.3 kg    Examination: General exam: Appears comfortable  HEENT: PERRLA, oral mucosa moist, no sclera icterus or thrush Respiratory system: Clear to auscultation. Respiratory effort normal. Cardiovascular system: S1 & S2 heard, regular rate and rhythm Gastrointestinal system: Abdomen soft, non-tender, nondistended. Normal bowel sounds   Central nervous system: Alert and oriented. No focal neurological deficits. Extremities: No cyanosis, clubbing or edema Skin: No rashes or ulcers Psychiatry:  Mood & affect appropriate.    Data Reviewed: I have personally reviewed following labs and imaging studies  CBC: Recent Labs  Lab  07/04/20 0113 07/04/20 0251 07/05/20 0412 07/07/20 0805 07/09/20 0457  WBC 25.5* 24.5* 15.4* 9.8 10.4  NEUTROABS 22.5* 20.7* 13.5*  --   --   HGB 14.3 14.5 11.8* 9.1* 10.1*  HCT 40.1 40.7 34.0* 27.8* 29.2*  MCV 86.1 86.8 89.9 90.8 89.6  PLT 203 195 122* 115* 132*   Basic Metabolic Panel: Recent Labs  Lab 07/04/20 1458 07/05/20 0412 07/06/20 0421 07/07/20 0805 07/08/20 0329 07/08/20 1648 07/09/20 1054  NA  --    < > 143 141 139 142 141  K  --    < > 4.0 3.4* 3.6 4.1 4.3  CL  --    < >  112* 110 108 108 106  CO2  --    < > 20* GLUCOSE  --    < > 95 96 93 111* 99  BUN  --    < > 75* 37* 25* 18 14  CREATININE  --    < > 3.71* 1.92* 1.55* 1.45* 1.28*  CALCIUM  --    < > 7.9* 8.0* 8.5* 8.7* 8.9  MG 2.8*  --   --   --   --   --   --   PHOS 6.9*  --   --   --  2.9  --   --    < > = values in this interval not displayed.   GFR: Estimated Creatinine Clearance: 103.2 mL/min (A) (by C-G formula based on SCr of 1.28 mg/dL (H)). Liver Function Tests: Recent Labs  Lab 07/04/20 1458 07/05/20 0412 07/07/20 0805 07/08/20 0329 07/08/20 1648 07/09/20 1054  AST 1,085* 1,100* 398*  --  205* 141*  ALT 443* 525* 173*  --  79* 57*  ALKPHOS 63 69 62  --  57 53  BILITOT 1.6* 1.3* 1.1  --  1.1 0.7  PROT 6.3* 5.6* 5.1*  --  5.2* 5.8*  ALBUMIN 3.7 3.2* 2.7* 2.7* 2.8* 3.0*   No results for input(s): LIPASE, AMYLASE in the last 168 hours. No results for input(s): AMMONIA in the last 168 hours. Coagulation Profile: Recent Labs  Lab 07/04/20 0639 07/05/20 0412  INR 1.3* 1.3*   Cardiac Enzymes: Recent Labs  Lab 07/05/20 0412 07/06/20 0421 07/07/20 0352 07/07/20 0805 07/08/20 0329  CKTOTAL 04,540* 98,119* 6,094* 4,884* 2,903*   BNP (last 3 results) No results for input(s): PROBNP in the last 8760 hours. HbA1C: No results for input(s): HGBA1C in the last 72 hours. CBG: Recent Labs  Lab 07/07/20 1627 07/07/20 2346 07/08/20 0631 07/08/20 1253 07/08/20 1612   GLUCAP 101* 82 119* 90 98   Lipid Profile: No results for input(s): CHOL, HDL, LDLCALC, TRIG, CHOLHDL, LDLDIRECT in the last 72 hours. Thyroid Function Tests: No results for input(s): TSH, T4TOTAL, FREET4, T3FREE, THYROIDAB in the last 72 hours. Anemia Panel: No results for input(s): VITAMINB12, FOLATE, FERRITIN, TIBC, IRON, RETICCTPCT in the last 72 hours. Urine analysis:    Component Value Date/Time   COLORURINE YELLOW 07/05/2020 1400   APPEARANCEUR HAZY (A) 07/05/2020 1400   LABSPEC 1.010 07/05/2020 1400   PHURINE 5.0 07/05/2020 1400   GLUCOSEU NEGATIVE 07/05/2020 1400   HGBUR LARGE (A) 07/05/2020 1400   BILIRUBINUR NEGATIVE 07/05/2020 1400   KETONESUR 5 (A) 07/05/2020 1400   PROTEINUR 30 (A) 07/05/2020 1400   NITRITE NEGATIVE 07/05/2020 1400   LEUKOCYTESUR NEGATIVE 07/05/2020 1400   Sepsis Labs: (procalcitonin:4,lacticidven:4) ) Recent Results (from the past 240 hour(s))  Resp Panel by RT-PCR (Flu A&B, Covid) Nasopharyngeal Swab     Status: None   Collection Time: 07/04/20  3:08 AM   Specimen: Nasopharyngeal Swab; Nasopharyngeal(NP) swabs in vial transport medium  Result Value Ref Range Status   SARS Coronavirus 2 by RT PCR NEGATIVE NEGATIVE Final    Comment: (NOTE) SARS-CoV-2 target nucleic acids are NOT DETECTED.  The SARS-CoV-2 RNA is generally detectable in upper respiratory specimens during the acute phase of infection. The lowest concentration of SARS-CoV-2 viral copies this assay can detect is 138 copies/mL. A negative result does not preclude SARS-Cov-2 infection and should not be used as the sole basis for treatment or other patient management decisions. A negative result  may occur with  improper specimen collection/handling, submission of specimen other than nasopharyngeal swab, presence of viral mutation(s) within the areas targeted by this assay, and inadequate number of viral copies(<138 copies/mL). A negative result must be combined  with clinical observations, patient history, and epidemiological information. The expected result is Negative.  Fact Sheet for Patients:  BloggerCourse.comhttps://www.fda.gov/media/152166/download  Fact Sheet for Healthcare Providers:  SeriousBroker.ithttps://www.fda.gov/media/152162/download  This test is no t yet approved or cleared by the [place] FDA and  has been authorized for detection and/or diagnosis of SARS-CoV-2 by FDA under an Emergency Use Authorization (EUA). This EUA will remain  in effect (meaning this test can be used) for the duration of the COVID-19 declaration under Section 564(b)(1) of the Act, 21 U.S.C.section 360bbb-3(b)(1), unless the authorization is terminated  or revoked sooner.       Influenza A by PCR NEGATIVE NEGATIVE Final   Influenza B by PCR NEGATIVE NEGATIVE Final    Comment: (NOTE) The Xpert Xpress SARS-CoV-2/FLU/RSV plus assay is intended as an aid in the diagnosis of influenza from Nasopharyngeal swab specimens and should not be used as a sole basis for treatment. Nasal washings and aspirates are unacceptable for Xpert Xpress SARS-CoV-2/FLU/RSV testing.  Fact Sheet for Patients: BloggerCourse.comhttps://www.fda.gov/media/152166/download  Fact Sheet for Healthcare Providers: SeriousBroker.ithttps://www.fda.gov/media/152162/download  This test is not yet approved or cleared by the [place] FDA and has been authorized for detection and/or diagnosis of SARS-CoV-2 by FDA under an Emergency Use Authorization (EUA). This EUA will remain in effect (meaning this test can be used) for the duration of the COVID-19 declaration under Section 564(b)(1) of the Act, 21 U.S.C. section 360bbb-3(b)(1), unless the authorization is terminated or revoked.  Performed at [facility], [address] [place], [place] [address]   Culture, blood (routine x 2)     Status: Abnormal   Collection Time: 07/04/20  6:13 AM   Specimen: BLOOD  Result Value Ref Range Status   Specimen Description BLOOD RIGHT  ANTECUBITAL  Final   Special Requests   Final    BOTTLES DRAWN AEROBIC AND ANAEROBIC Blood Culture adequate volume   Culture  Setup Time   Final    GRAM POSITIVE COCCI IN CLUSTERS IN BOTH AEROBIC AND ANAEROBIC BOTTLES CRITICAL RESULT CALLED TO, READ BACK BY AND VERIFIED WITH: Melven SartoriusJ LEDFORD [facility] 07/04/20 2316 JDW Performed at [facility], [address] [place], [place] [address]    Culture STAPHYLOCOCCUS AUREUS (A)  Final   Report Status 07/09/2020 FINAL  Final   Organism ID, Bacteria STAPHYLOCOCCUS AUREUS  Final      Susceptibility   Staphylococcus aureus - MIC*    CIPROFLOXACIN <=0.5 SENSITIVE Sensitive     ERYTHROMYCIN >=8 RESISTANT Resistant     GENTAMICIN <=0.5 SENSITIVE Sensitive     OXACILLIN <=0.25 SENSITIVE Sensitive     TETRACYCLINE <=1 SENSITIVE Sensitive     VANCOMYCIN <=0.5 SENSITIVE Sensitive     TRIMETH/SULFA <=10 SENSITIVE Sensitive     CLINDAMYCIN RESISTANT Resistant     RIFAMPIN <=0.5 SENSITIVE Sensitive     Inducible Clindamycin POSITIVE Resistant     * STAPHYLOCOCCUS AUREUS  Blood Culture ID Panel (Reflexed)     Status: Abnormal   Collection Time: 07/04/20  6:13 AM  Result Value Ref Range Status   Enterococcus faecalis NOT DETECTED NOT DETECTED Final   Enterococcus Faecium NOT DETECTED NOT DETECTED Final   Listeria monocytogenes NOT DETECTED NOT DETECTED Final   Staphylococcus species DETECTED (A) NOT DETECTED Final  Comment: CRITICAL RESULT CALLED TO, READ BACK BY AND VERIFIED WITH: J LEDFORD PHARMD 07/04/20 2316 JDW    Staphylococcus aureus (BCID) DETECTED (A) NOT DETECTED Final    Comment: Methicillin (oxacillin) susceptible Staphylococcus aureus (MSSA). Preferred therapy is anti staphylococcal beta lactam antibiotic (Cefazolin or Nafcillin), unless clinically contraindicated. CRITICAL RESULT CALLED TO, READ BACK BY AND VERIFIED WITH: J LEDFORD [facility] 07/04/20 2316 JDW    Staphylococcus epidermidis DETECTED (A) NOT DETECTED Final    Comment:  CRITICAL RESULT CALLED TO, READ BACK BY AND VERIFIED WITH: J LEDFORD PHARMD 07/04/20 2316 JDW    Staphylococcus lugdunensis NOT DETECTED NOT DETECTED Final   Streptococcus species DETECTED (A) NOT DETECTED Final    Comment: Not Enterococcus species, Streptococcus agalactiae, Streptococcus pyogenes, or Streptococcus pneumoniae. CRITICAL RESULT CALLED TO, READ BACK BY AND VERIFIED WITH: J LEDFORD PHARMD 07/04/20 2316 JDW    Streptococcus agalactiae NOT DETECTED NOT DETECTED Final   Streptococcus pneumoniae NOT DETECTED NOT DETECTED Final   Streptococcus pyogenes NOT DETECTED NOT DETECTED Final   A.calcoaceticus-baumannii NOT DETECTED NOT DETECTED Final   Bacteroides fragilis NOT DETECTED NOT DETECTED Final   Enterobacterales NOT DETECTED NOT DETECTED Final   Enterobacter cloacae complex NOT DETECTED NOT DETECTED Final   Escherichia coli NOT DETECTED NOT DETECTED Final   Klebsiella aerogenes NOT DETECTED NOT DETECTED Final   Klebsiella oxytoca NOT DETECTED NOT DETECTED Final   Klebsiella pneumoniae NOT DETECTED NOT DETECTED Final   Proteus species NOT DETECTED NOT DETECTED Final   Salmonella species NOT DETECTED NOT DETECTED Final   Serratia marcescens NOT DETECTED NOT DETECTED Final   Haemophilus influenzae NOT DETECTED NOT DETECTED Final   Neisseria meningitidis NOT DETECTED NOT DETECTED Final   Pseudomonas aeruginosa NOT DETECTED NOT DETECTED Final   Stenotrophomonas maltophilia NOT DETECTED NOT DETECTED Final   Candida albicans NOT DETECTED NOT DETECTED Final   Candida auris NOT DETECTED NOT DETECTED Final   Candida glabrata NOT DETECTED NOT DETECTED Final   Candida krusei NOT DETECTED NOT DETECTED Final   Candida parapsilosis NOT DETECTED NOT DETECTED Final   Candida tropicalis NOT DETECTED NOT DETECTED Final   Cryptococcus neoformans/gattii NOT DETECTED NOT DETECTED Final   Methicillin resistance mecA/C NOT DETECTED NOT DETECTED Final   Meth resistant mecA/C and MREJ NOT DETECTED  NOT DETECTED Final    Comment: Performed at [facility], [address], [place], [place] [address]  Culture, blood (routine x 2)     Status: None   Collection Time: 07/04/20  7:55 AM   Specimen: BLOOD RIGHT HAND  Result Value Ref Range Status   Specimen Description BLOOD RIGHT HAND  Final   Special Requests   Final    BOTTLES DRAWN AEROBIC AND ANAEROBIC Blood Culture adequate volume   Culture   Final    NO GROWTH 5 DAYS Performed at [facility], [address], [place], [place] [address]    Report Status 07/09/2020 FINAL  Final  Culture, blood (routine x 2)     Status: None (Preliminary result)   Collection Time: 07/06/20  4:26 AM   Specimen: BLOOD  Result Value Ref Range Status   Specimen Description BLOOD RIGHT ANTECUBITAL  Final   Special Requests AEROBIC BOTTLE ONLY Blood Culture adequate volume  Final   Culture   Final    NO GROWTH 4 DAYS Performed at [facility], [address], [place], [place] [address]    Report Status PENDING  Incomplete  Culture, blood (routine  x 2)     Status: None (Preliminary result)   Collection Time: 07/06/20  4:29 AM   Specimen: BLOOD  Result Value Ref Range Status   Specimen Description BLOOD LEFT ANTECUBITAL  Final   Special Requests AEROBIC BOTTLE ONLY Blood Culture adequate volume  Final   Culture   Final    NO GROWTH 4 DAYS Performed at [facility], [address], [place], [place] [address]    Report Status PENDING  Incomplete         Radiology Studies: No results found.    Scheduled Meds: . folic acid  1 mg Oral Daily  . melatonin  5 mg Oral QHS  . multivitamin with minerals  1 tablet Oral Daily  . nicotine  21 mg Transdermal Daily  . thiamine  100 mg Oral Daily   Or  . thiamine  100 mg Intravenous Daily   Continuous Infusions: . sodium chloride Stopped (07/10/20 0629)  .  ceFAZolin (ANCEF) IV 200 mL/hr at 07/10/20 0700     LOS: 6 days      Calvert Cantor, MD [facility] Pager: [email or web address] 07/10/2020, 1:30 PM

## 2020-07-11 ENCOUNTER — Encounter (HOSPITAL_COMMUNITY): Payer: Self-pay | Admitting: Internal Medicine

## 2020-07-11 ENCOUNTER — Telehealth: Payer: Self-pay | Admitting: *Deleted

## 2020-07-11 ENCOUNTER — Other Ambulatory Visit: Payer: Self-pay | Admitting: Internal Medicine

## 2020-07-11 ENCOUNTER — Encounter (HOSPITAL_COMMUNITY)
Admission: EM | Disposition: A | Discharge status: Home / Self Care | Payer: Self-pay | Source: Home / Self Care | Attending: Internal Medicine

## 2020-07-11 ENCOUNTER — Inpatient Hospital Stay (HOSPITAL_COMMUNITY): Payer: BC Managed Care – PPO | Admitting: Anesthesiology

## 2020-07-11 ENCOUNTER — Inpatient Hospital Stay (HOSPITAL_COMMUNITY): Payer: BC Managed Care – PPO

## 2020-07-11 DIAGNOSIS — R7881 Bacteremia: Secondary | ICD-10-CM

## 2020-07-11 DIAGNOSIS — B9561 Methicillin susceptible Staphylococcus aureus infection as the cause of diseases classified elsewhere: Secondary | ICD-10-CM

## 2020-07-11 DIAGNOSIS — G9340 Encephalopathy, unspecified: Secondary | ICD-10-CM | POA: Diagnosis not present

## 2020-07-11 DIAGNOSIS — R7981 Abnormal blood-gas level: Secondary | ICD-10-CM | POA: Diagnosis not present

## 2020-07-11 HISTORY — PX: BUBBLE STUDY: SHX6837

## 2020-07-11 HISTORY — PX: TEE WITHOUT CARDIOVERSION: SHX5443

## 2020-07-11 LAB — CULTURE, BLOOD (ROUTINE X 2)
Culture: NO GROWTH
Culture: NO GROWTH
Special Requests: ADEQUATE
Special Requests: ADEQUATE

## 2020-07-11 SURGERY — ECHOCARDIOGRAM, TRANSESOPHAGEAL
Anesthesia: Monitor Anesthesia Care

## 2020-07-11 MED ORDER — ACETAMINOPHEN 325 MG PO TABS: 650.0000 mg | ORAL_TABLET | Freq: Four times a day (QID) | ORAL | Status: DC | PRN

## 2020-07-11 MED ORDER — IBUPROFEN 200 MG PO TABS: 400.0000 mg | ORAL_TABLET | ORAL | Status: DC | PRN

## 2020-07-11 MED ORDER — FENTANYL CITRATE (PF) 250 MCG/5ML IJ SOLN
INTRAMUSCULAR | Status: DC | PRN
  Administered 2020-07-11: 100 ug via INTRAVENOUS
  Administered 2020-07-11 (×2): 50 ug via INTRAVENOUS

## 2020-07-11 MED ORDER — LIDOCAINE 2% (20 MG/ML) 5 ML SYRINGE
INTRAMUSCULAR | Status: AC
  Filled 2020-07-11: qty 5

## 2020-07-11 MED ORDER — MIDAZOLAM HCL 2 MG/2ML IJ SOLN
INTRAMUSCULAR | Status: DC | PRN
  Administered 2020-07-11: 2 mg via INTRAVENOUS

## 2020-07-11 MED ORDER — MIDAZOLAM HCL 2 MG/2ML IJ SOLN
INTRAMUSCULAR | Status: AC
  Filled 2020-07-11: qty 2

## 2020-07-11 MED ORDER — LIDOCAINE 2% (20 MG/ML) 5 ML SYRINGE
INTRAMUSCULAR | Status: DC | PRN
  Administered 2020-07-11: 20 mg via INTRAVENOUS
  Administered 2020-07-11: 30 mg via INTRAVENOUS

## 2020-07-11 MED ORDER — ORITAVANCIN DIPHOSPHATE 400 MG IV SOLR
1200.0000 mg | Freq: Once | INTRAVENOUS | Status: AC
  Administered 2020-07-11: 1200 mg via INTRAVENOUS
  Filled 2020-07-11 (×2): qty 120

## 2020-07-11 MED ORDER — SODIUM CHLORIDE 0.9 % IV SOLN: INTRAVENOUS | Status: DC

## 2020-07-11 MED ORDER — LINEZOLID 600 MG PO TABS
600.0000 mg | ORAL_TABLET | Freq: Two times a day (BID) | ORAL | 0 refills | Status: AC
Start: 2020-07-11 — End: 2020-07-25

## 2020-07-11 MED ORDER — PROPOFOL 500 MG/50ML IV EMUL
INTRAVENOUS | Status: DC | PRN
  Administered 2020-07-11: 225 ug/kg/min via INTRAVENOUS

## 2020-07-11 MED ORDER — PROPOFOL 10 MG/ML IV BOLUS
INTRAVENOUS | Status: DC | PRN
  Administered 2020-07-11 (×2): 30 mg via INTRAVENOUS

## 2020-07-11 MED ORDER — FENTANYL CITRATE (PF) 250 MCG/5ML IJ SOLN
INTRAMUSCULAR | Status: AC
  Filled 2020-07-11: qty 5

## 2020-07-11 MED FILL — LINEZOLID 600 MG TABS: 600 | 14 days supply | Qty: 28 | Fill #0

## 2020-07-11 NOTE — Anesthesia Preprocedure Evaluation (Signed)
Anesthesia Evaluation  Patient identified by MRN, date of birth, ID band Patient awake    Reviewed: Allergy & Precautions, NPO status , Patient's Chart, lab work & pertinent test results  Airway Mallampati: II  TM Distance: >3 FB Neck ROM: Full    Dental no notable dental hx. (+) Teeth Intact   Pulmonary Current Smoker and Patient abstained from smoking.,    Pulmonary exam normal breath sounds clear to auscultation       Cardiovascular negative cardio ROS Normal cardiovascular exam Rhythm:Regular Rate:Normal     Neuro/Psych PSYCHIATRIC DISORDERS Acute delirium on admission    GI/Hepatic negative GI ROS, (+)     substance abuse  cocaine use and IV drug use, Hepatitis -, CElevated LFT's   Endo/Other  negative endocrine ROS  Renal/GU Renal diseaseAKI  negative genitourinary   Musculoskeletal negative musculoskeletal ROS (+) narcotic dependent  Abdominal   Peds  Hematology  (+) anemia , MSSA bacteremia   Anesthesia Other Findings   Reproductive/Obstetrics                             Anesthesia Physical Anesthesia Plan  ASA: III  Anesthesia Plan: MAC   Post-op Pain Management:    Induction: Intravenous  PONV Risk Score and Plan: 0 and Treatment may vary due to age or medical condition and Propofol infusion  Airway Management Planned: Natural Airway and Nasal Cannula  Additional Equipment:   Intra-op Plan:   Post-operative Plan:   Informed Consent: I have reviewed the patients History and Physical, chart, labs and discussed the procedure including the risks, benefits and alternatives for the proposed anesthesia with the patient or authorized representative who has indicated his/her understanding and acceptance.     Dental advisory given  Plan Discussed with: CRNA and Anesthesiologist  Anesthesia Plan Comments:         Anesthesia Quick Evaluation

## 2020-07-11 NOTE — Progress Notes (Addendum)
I have seen and examined the patient. I have personally reviewed the clinical findings, laboratory findings, microbiological data and imaging studies. The assessment and treatment plan was discussed with the  Advance Practice Provider, Rexene Alberts  I agree with her/his recommendations except following additions/corrections.  Afebrile, repeat blood cx on 2/16 continue to remain NG in 5 days  TTE with no vegetations TEE with no vegetations  Mental status is improved. Awake, alert and oriented *3  Cr normal, AST and ALT downtrending HCV RNA negative  Continue cefazolin   Ideally, would prefer 2 weeks of IV cefazolin for MSSA bacteremia ( most likely a skin source). However, given his h/o IVDA,  if 2 weeks of IV cefazolin is not possible, can give one dose of Oritavancin along with 2 more weeks of Linezolid as  an alternative.    Odette Fraction, MD [facility]       [facility]  Date of Admission:  07/04/2020      Total days of antibiotics 8  Day 8 cefazolin          ASSESSMENT: Zachary Hurst is a 30 y.o. male with history of polysubstance use admitted with encephalopathy - found to have AKI with creatinine > 4.0, elevated transaminases and MSSA bacteremia from undetermined source.   Encephalopathy has resolved. Awaiting TEE - was not able to do last week d/t combative/encephalopathy and patient refusal. D/W cardiology and Dr. Butler Denmark and extremely unlikely he will get this done today. He would like to eat and try again for add on spot tomorrow. Scheduled Wednesday.   R foot xray reviewed with [place] and no fractures or evidence of infection. No need for further imaging. Conservative care / management.   LFTs coming down. Creatinine coming down. He has cleared his bacteremia, resolved leukocytosis.    Hep C RNA negative with (+) antibody - indicates spontaneous clearance. No need for  further treatment. At risk for re-infection however as long as substance use is ongoing.    PLAN: 1. Continue IV cefazolin  2. Follow for TEE results - will see him back Wednesday with remote check tomorrow to see if results are available.  3. No Hep C tx needed    Principal Problem:   MSSA bacteremia Active Problems:   Acute encephalopathy   Acute delirium   Elevated LFTs   ARF (acute renal failure) (HCC)   Hepatitis C antibody positive in blood   . folic acid  1 mg Oral Daily  . melatonin  5 mg Oral QHS  . multivitamin with minerals  1 tablet Oral Daily  . nicotine  21 mg Transdermal Daily  . thiamine  100 mg Oral Daily   Or  . thiamine  100 mg Intravenous Daily    SUBJECTIVE: Feeling like he wants coffee. Only pain is the left toes and forefeet. Can walk well without limping.  No fevers/chillls.  Apologizing for how he acted last week.     Review of Systems: Review of Systems  Constitutional: Negative for chills and fever.  HENT: Negative for tinnitus.   Eyes: Negative for blurred vision and photophobia.  Respiratory: Negative for cough and sputum production.   Cardiovascular: Negative for chest pain.  Gastrointestinal: Negative for diarrhea, nausea and vomiting.  Genitourinary: Negative for dysuria.  Musculoskeletal: Positive for joint pain (R toes ). Negative for back pain.  Skin: Negative for rash.  Neurological: Negative for headaches.  Psychiatric/Behavioral: Negative  for depression. The patient is not nervous/anxious.     No Known Allergies  OBJECTIVE: Vitals:   07/10/20 2009 07/11/20 0436 07/11/20 0737 07/11/20 1132  BP: (!) 141/86 (!) 124/58 124/68 (!) 157/95  Pulse: 99 83 82 82  Resp: 18 18 16 18   Temp: 98.3 F (36.8 C) 98.7 F (37.1 C)  97.8 F (36.6 C)  TempSrc: Oral Oral  Oral  SpO2: 100% 99% 99% 100%  Weight:      Height:       Body mass index is 28.01 kg/m.  Physical Exam Constitutional:      Appearance: Normal appearance.      Comments: Sitting comfortably in bed  HENT:     Mouth/Throat:     Mouth: Mucous membranes are moist.     Pharynx: Oropharynx is clear.  Eyes:     Pupils: Pupils are equal, round, and reactive to light.  Cardiovascular:     Rate and Rhythm: Normal rate.     Heart sounds: No murmur heard.   Pulmonary:     Effort: Pulmonary effort is normal.     Breath sounds: Normal breath sounds.  Abdominal:     General: There is no distension.  Musculoskeletal:     Comments: No swelling or redness/warmth to any joints in foot or ankle. Pain reported throughout the assessment.   Skin:    General: Skin is warm and dry.     Capillary Refill: Capillary refill takes less than 2 seconds.     Comments: Multiple cuts / abrasions on left toes and alteral ankle.   Neurological:     Mental Status: He is alert and oriented to person, place, and time.  Psychiatric:        Mood and Affect: Mood normal.     Lab Results Lab Results  Component Value Date   WBC 10.4 07/09/2020   HGB 10.1 (L) 07/09/2020   HCT 29.2 (L) 07/09/2020   MCV 89.6 07/09/2020   PLT 132 (L) 07/09/2020    Lab Results  Component Value Date   CREATININE 1.18 07/10/2020   BUN 13 07/10/2020   NA 141 07/10/2020   K 4.2 07/10/2020   CL 106 07/10/2020   CO2 28 07/10/2020    Lab Results  Component Value Date   ALT 37 07/10/2020   AST 71 (H) 07/10/2020   ALKPHOS 49 07/10/2020   BILITOT 0.5 07/10/2020     Microbiology: BCx 2/13 >> 2/2 bottles  BCx 2/14 >> negative x 1 set, final  BCx 2/15 >> negative x 2 set, final    3/15, MSN, NP-C [facility]  [email or web address] Pager: [phone] Office: [phone] [facility] Main Line: [phone]

## 2020-07-11 NOTE — Telephone Encounter (Signed)
Harvin Hazel, Case Manager with [facility], called in requesting OUD appt for patient who will be d/c today or tomorrow. States Provider would like to start him on suboxone but will need f/u with [facility] Clinic. Per chart review, patient has hx of IV heroin mixed with fentanyl, and cocaine. Patient has been in in-patient rehab 3 times. Mother is not letting him live with her.   Call placed to patient room for OUD intake questions. No answer. Call placed to cell number listed in Epic. Mother answered. States this is her number, and patient does not have phone at this time. Kinnie Feil, BSN, RN-BC

## 2020-07-11 NOTE — Progress Notes (Signed)
  Echocardiogram Echocardiogram Transesophageal has been performed.  [name] Zachary Hurst 07/11/2020, 3:29 PM

## 2020-07-11 NOTE — Progress Notes (Addendum)
ID Progress Note:    Mr. Zachary Hurst TEE this afternoon is negative for any endocarditis. Suspect transient from skin/soft tissue (he has many lesions on arms and feet/ankle) vs injection technique.   Ideally, recommend to continue 2 weeks of IV cefazolin from date of negative blood cultures. However, if that is not a possibility given concerns of IVDU, can give one dose of oritavancin followed by 2 more weeks of PO linezolid as an alternative   A follow up in ID clinic to see Dr. Elinor Parkinson on March 3rd @ 3:00 pm  has been made  He has plans to follow with IM clinic for suboxone management with [facility].     Zachary Alberts, MSN, NP-C [facility]  [email or web address] Pager: [phone] Office: [phone] [facility] Main Line: [phone]

## 2020-07-11 NOTE — Progress Notes (Signed)
PROGRESS NOTE    Zachary Hurst   WGN:[id number]  DOB: Sep 26, 1990  DOA: 07/04/2020 PCP: Pcp, No   Brief Narrative:  Zachary Hurst is a 30 year old male with a history of heroin and cocaine abuse who was brought to the hospital by EMS with severe acute confusion his girlfriend called them.  According to the girlfriend the patient had been using heroin and cocaine that day and subsequently became extremely aggressive yelling and screaming and flailing his extremities. Further history obtained from the patient's mother.  She states that the patient has been in a drug rehab center for 2 months and was released on Friday on a pass for the weekend.  He visited his mother on Saturday who suspected that he may be high but did not notice any track marks at that time. The patient's mother further states that about 2 years ago the patient was admitted to [facility] after a similar episode of severe agitation when he used heroin.  She states that IV Ativan was ineffective in reducing his agitation and he was eventually placed in a medically induced coma.  He recuperated and was discharged within 6 days of being admitted.  He has been admitted to an inpatient rehab facility on 3 separate occasions but has relapsed each time.  Given Geodon in the ED for his severe agitation and this helped him.  Subjective: He has no complaints.     Assessment & Plan:   Principal Problem:   Acute encephalopathy - multifactorial-he currently has bacteremia with staph and strep, AKI, and has used heroin which may have been mixed with fentanyl and other substances along with cocaine a few days ago - has resolved- very oriented now, out of restraints and able to understand and comply with the plan of treatment  Active Problems:  Staph bacteremia with leukocytosis -Likely related to IV drug abuse-he also snorts cocaine and may have some breakdown of the nasal mucosa-he also has numerous scabs on his feet and legs  which may be entry points for bacteria -Continue cefazolin -2D echo reveals no vegetations  -Appreciate ID consult-  - repeat cultures are negative - we are awaiting a TEE to make a final decision on duration of antibiotics   Right arm swelling/ superficial venous thrombosis of right cephalic vein - swelling of anti cubital area, arm and forearm with tenderness- ? If IV infiltrated- - 2/10> obtained a venous duplex to r/u DVT. No DVT but has SVT as mentioned- heat packs ordered  Drug abuse -With IV drugs and snorting drugs -Mother states that does not have any more money to pay for inpatient rehab facilities that she has paid for these facilities on 3 different occasions -He is a candidate for Suboxone or methadone and hopefully will be able to find a clinic for him to go to once he is closer to discharge  Rhabdomyolysis with AKI and metabolic acidosis -Suspect his AKI is related to rhabdomyolysis and some dehydration -His rhabdomyolysis may be related to the severe agitation and flailing of extremities noted by his girlfriend who sent a video of this to his mother-his mother feels that this is possibly related to fentanyl mixed in with the heroin that he was using -CK levels and Cr now improved-     Elevated LFTs -Likely related to sepsis and rhabdomyolysis related -his mom states that he does not drink alcohol -AST 1085 and ALT 443- also improving    Hepatitis C antibody positive in blood  Thrombocytopenia- ? Due to  infection - acute in the hospital- platelets steadily improving-    Time spent in minutes: 35-  DVT prophylaxis: SCDs Start: 07/04/20 0224 Code Status: Full code Family Communication: Mother Level of Care: Level of care: Med-Surg- downgrade to med/surg Disposition Plan:  Status is: Inpatient  Remains inpatient appropriate because:IV treatments appropriate due to intensity of illness or inability to take PO   Dispo: The patient is from: Home               Anticipated d/c is to: Home              Anticipated d/c date is: > 3 days              Patient currently is not medically stable to d/c.   Difficult to place patient No  Consultants:   ID  Nephrology  Procedures:   2D echo Antimicrobials:  Anti-infectives (From admission, onward)   Start     Dose/Rate Route Frequency Ordered Stop   07/07/20 2000  ceFAZolin (ANCEF) IVPB 2g/100 mL premix        2 g 200 mL/hr over 30 Minutes Intravenous Every 8 hours 07/07/20 1617     07/04/20 2345  ceFAZolin (ANCEF) IVPB 2g/100 mL premix  Status:  Discontinued        2 g 200 mL/hr over 30 Minutes Intravenous Every 12 hours 07/04/20 2330 07/07/20 1617   07/04/20 2330  ceFAZolin (ANCEF) IVPB 2g/100 mL premix  Status:  Discontinued        2 g 200 mL/hr over 30 Minutes Intravenous Every 8 hours 07/04/20 2328 07/04/20 2329       Objective: Vitals:   07/10/20 2009 07/11/20 0436 07/11/20 0737 07/11/20 1132  BP: (!) 141/86 (!) 124/58 124/68 (!) 157/95  Pulse: 99 83 82 82  Resp: 18 18 16 18   Temp: 98.3 F (36.8 C) 98.7 F (37.1 C)  97.8 F (36.6 C)  TempSrc: Oral Oral  Oral  SpO2: 100% 99% 99% 100%  Weight:      Height:        Intake/Output Summary (Last 24 hours) at 07/11/2020 1300 Last data filed at 07/10/2020 2239 Gross per 24 hour  Intake 240 ml  Output --  Net 240 ml   Filed Weights   07/05/20 0800 07/06/20 0600  Weight: 95 kg 96.3 kg    Examination: General exam: Appears comfortable  HEENT: PERRLA, oral mucosa moist, no sclera icterus or thrush Respiratory system: Clear to auscultation. Respiratory effort normal. Cardiovascular system: S1 & S2 heard, regular rate and rhythm Gastrointestinal system: Abdomen soft, non-tender, nondistended. Normal bowel sounds   Central nervous system: Alert and oriented. No focal neurological deficits. Extremities: No cyanosis, clubbing or edema-right arm swelling under anti cubital fossa Skin: No rashes or ulcers Psychiatry:  Mood & affect  appropriate.   Data Reviewed: I have personally reviewed following labs and imaging studies  CBC: Recent Labs  Lab 07/05/20 0412 07/07/20 0805 07/09/20 0457  WBC 15.4* 9.8 10.4  NEUTROABS 13.5*  --   --   HGB 11.8* 9.1* 10.1*  HCT 34.0* 27.8* 29.2*  MCV 89.9 90.8 89.6  PLT 122* 115* 132*   Basic Metabolic Panel: Recent Labs  Lab 07/04/20 1458 07/05/20 0412 07/07/20 0805 07/08/20 0329 07/08/20 1648 07/09/20 1054 07/10/20 1406  NA  --    < > 141 139 142 141 141  K  --    < > 3.4* 3.6 4.1 4.3 4.2  CL  --    < >  110 108 108 106 106  CO2  --    < > GLUCOSE  --    < > 96 93 111* 99 112*  BUN  --    < > 37* 25* CREATININE  --    < > 1.92* 1.55* 1.45* 1.28* 1.18  CALCIUM  --    < > 8.0* 8.5* 8.7* 8.9 8.9  MG 2.8*  --   --   --   --   --   --   PHOS 6.9*  --   --  2.9  --   --   --    < > = values in this interval not displayed.   GFR: Estimated Creatinine Clearance: 112 mL/min (by C-G formula based on SCr of 1.18 mg/dL). Liver Function Tests: Recent Labs  Lab 07/05/20 0412 07/07/20 0805 07/08/20 0329 07/08/20 1648 07/09/20 1054 07/10/20 1406  AST 1,100* 398*  --  205* 141* 71*  ALT 525* 173*  --  79* 57* 37  ALKPHOS 69 62  --  57 53 49  BILITOT 1.3* 1.1  --  1.1 0.7 0.5  PROT 5.6* 5.1*  --  5.2* 5.8* 5.8*  ALBUMIN 3.2* 2.7* 2.7* 2.8* 3.0* 3.1*   No results for input(s): LIPASE, AMYLASE in the last 168 hours. No results for input(s): AMMONIA in the last 168 hours. Coagulation Profile: Recent Labs  Lab 07/05/20 0412  INR 1.3*   Cardiac Enzymes: Recent Labs  Lab 07/05/20 0412 07/06/20 0421 07/07/20 0352 07/07/20 0805 07/08/20 0329  CKTOTAL 16,109* 60,454* 6,094* 4,884* 2,903*   BNP (last 3 results) No results for input(s): PROBNP in the last 8760 hours. HbA1C: No results for input(s): HGBA1C in the last 72 hours. CBG: Recent Labs  Lab 07/07/20 1627 07/07/20 2346 07/08/20 0631 07/08/20 1253 07/08/20 1612  GLUCAP 101*  82 119* 90 98   Lipid Profile: No results for input(s): CHOL, HDL, LDLCALC, TRIG, CHOLHDL, LDLDIRECT in the last 72 hours. Thyroid Function Tests: No results for input(s): TSH, T4TOTAL, FREET4, T3FREE, THYROIDAB in the last 72 hours. Anemia Panel: No results for input(s): VITAMINB12, FOLATE, FERRITIN, TIBC, IRON, RETICCTPCT in the last 72 hours. Urine analysis:    Component Value Date/Time   COLORURINE YELLOW 07/05/2020 1400   APPEARANCEUR HAZY (A) 07/05/2020 1400   LABSPEC 1.010 07/05/2020 1400   PHURINE 5.0 07/05/2020 1400   GLUCOSEU NEGATIVE 07/05/2020 1400   HGBUR LARGE (A) 07/05/2020 1400   BILIRUBINUR NEGATIVE 07/05/2020 1400   KETONESUR 5 (A) 07/05/2020 1400   PROTEINUR 30 (A) 07/05/2020 1400   NITRITE NEGATIVE 07/05/2020 1400   LEUKOCYTESUR NEGATIVE 07/05/2020 1400   Sepsis Labs: (procalcitonin:4,lacticidven:4) ) Recent Results (from the past 240 hour(s))  Resp Panel by RT-PCR (Flu A&B, Covid) Nasopharyngeal Swab     Status: None   Collection Time: 07/04/20  3:08 AM   Specimen: Nasopharyngeal Swab; Nasopharyngeal(NP) swabs in vial transport medium  Result Value Ref Range Status   SARS Coronavirus 2 by RT PCR NEGATIVE NEGATIVE Final    Comment: (NOTE) SARS-CoV-2 target nucleic acids are NOT DETECTED.  The SARS-CoV-2 RNA is generally detectable in upper respiratory specimens during the acute phase of infection. The lowest concentration of SARS-CoV-2 viral copies this assay can detect is 138 copies/mL. A negative result does not preclude SARS-Cov-2 infection and should not be used as the sole basis for treatment or other patient management decisions. A negative result may occur with  improper  specimen collection/handling, submission of specimen other than nasopharyngeal swab, presence of viral mutation(s) within the areas targeted by this assay, and inadequate number of viral copies(<138 copies/mL). A negative result must be combined with clinical  observations, patient history, and epidemiological information. The expected result is Negative.  Fact Sheet for Patients:  BloggerCourse.com  Fact Sheet for Healthcare Providers:  SeriousBroker.it  This test is no t yet approved or cleared by the [place] FDA and  has been authorized for detection and/or diagnosis of SARS-CoV-2 by FDA under an Emergency Use Authorization (EUA). This EUA will remain  in effect (meaning this test can be used) for the duration of the COVID-19 declaration under Section 564(b)(1) of the Act, 21 U.S.C.section 360bbb-3(b)(1), unless the authorization is terminated  or revoked sooner.       Influenza A by PCR NEGATIVE NEGATIVE Final   Influenza B by PCR NEGATIVE NEGATIVE Final    Comment: (NOTE) The Xpert Xpress SARS-CoV-2/FLU/RSV plus assay is intended as an aid in the diagnosis of influenza from Nasopharyngeal swab specimens and should not be used as a sole basis for treatment. Nasal washings and aspirates are unacceptable for Xpert Xpress SARS-CoV-2/FLU/RSV testing.  Fact Sheet for Patients: BloggerCourse.com  Fact Sheet for Healthcare Providers: SeriousBroker.it  This test is not yet approved or cleared by the [place] FDA and has been authorized for detection and/or diagnosis of SARS-CoV-2 by FDA under an Emergency Use Authorization (EUA). This EUA will remain in effect (meaning this test can be used) for the duration of the COVID-19 declaration under Section 564(b)(1) of the Act, 21 U.S.C. section 360bbb-3(b)(1), unless the authorization is terminated or revoked.  Performed at [facility], [address], [place], [place] [address]   Culture, blood (routine x 2)     Status: Abnormal   Collection Time: 07/04/20  6:13 AM   Specimen: BLOOD  Result Value Ref Range Status   Specimen Description BLOOD RIGHT ANTECUBITAL  Final    Special Requests   Final    BOTTLES DRAWN AEROBIC AND ANAEROBIC Blood Culture adequate volume   Culture  Setup Time   Final    GRAM POSITIVE COCCI IN CLUSTERS IN BOTH AEROBIC AND ANAEROBIC BOTTLES CRITICAL RESULT CALLED TO, READ BACK BY AND VERIFIED WITH: Melven Sartorius [facility] 07/04/20 2316 JDW Performed at [facility], [address], [place], [place] [address]    Culture STAPHYLOCOCCUS AUREUS (A)  Final   Report Status 07/09/2020 FINAL  Final   Organism ID, Bacteria STAPHYLOCOCCUS AUREUS  Final      Susceptibility   Staphylococcus aureus - MIC*    CIPROFLOXACIN <=0.5 SENSITIVE Sensitive     ERYTHROMYCIN >=8 RESISTANT Resistant     GENTAMICIN <=0.5 SENSITIVE Sensitive     OXACILLIN <=0.25 SENSITIVE Sensitive     TETRACYCLINE <=1 SENSITIVE Sensitive     VANCOMYCIN <=0.5 SENSITIVE Sensitive     TRIMETH/SULFA <=10 SENSITIVE Sensitive     CLINDAMYCIN RESISTANT Resistant     RIFAMPIN <=0.5 SENSITIVE Sensitive     Inducible Clindamycin POSITIVE Resistant     * STAPHYLOCOCCUS AUREUS  Blood Culture ID Panel (Reflexed)     Status: Abnormal   Collection Time: 07/04/20  6:13 AM  Result Value Ref Range Status   Enterococcus faecalis NOT DETECTED NOT DETECTED Final   Enterococcus Faecium NOT DETECTED NOT DETECTED Final   Listeria monocytogenes NOT DETECTED NOT DETECTED Final   Staphylococcus species DETECTED (A) NOT DETECTED Final    Comment: CRITICAL  RESULT CALLED TO, READ BACK BY AND VERIFIED WITH: J [facility] 07/04/20 2316 JDW    Staphylococcus aureus (BCID) DETECTED (A) NOT DETECTED Final    Comment: Methicillin (oxacillin) susceptible Staphylococcus aureus (MSSA). Preferred therapy is anti staphylococcal beta lactam antibiotic (Cefazolin or Nafcillin), unless clinically contraindicated. CRITICAL RESULT CALLED TO, READ BACK BY AND VERIFIED WITH: [facility] 07/04/20 2316 JDW    Staphylococcus epidermidis DETECTED (A) NOT DETECTED Final    Comment: CRITICAL RESULT CALLED TO,  READ BACK BY AND VERIFIED WITH: J LEDFORD [facility] 07/04/20 2316 JDW    Staphylococcus lugdunensis NOT DETECTED NOT DETECTED Final   Streptococcus species DETECTED (A) NOT DETECTED Final    Comment: Not Enterococcus species, Streptococcus agalactiae, Streptococcus pyogenes, or Streptococcus pneumoniae. CRITICAL RESULT CALLED TO, READ BACK BY AND VERIFIED WITH: J LEDFORD [facility] 07/04/20 2316 JDW    Streptococcus agalactiae NOT DETECTED NOT DETECTED Final   Streptococcus pneumoniae NOT DETECTED NOT DETECTED Final   Streptococcus pyogenes NOT DETECTED NOT DETECTED Final   A.calcoaceticus-baumannii NOT DETECTED NOT DETECTED Final   Bacteroides fragilis NOT DETECTED NOT DETECTED Final   Enterobacterales NOT DETECTED NOT DETECTED Final   Enterobacter cloacae complex NOT DETECTED NOT DETECTED Final   Escherichia coli NOT DETECTED NOT DETECTED Final   Klebsiella aerogenes NOT DETECTED NOT DETECTED Final   Klebsiella oxytoca NOT DETECTED NOT DETECTED Final   Klebsiella pneumoniae NOT DETECTED NOT DETECTED Final   Proteus species NOT DETECTED NOT DETECTED Final   Salmonella species NOT DETECTED NOT DETECTED Final   Serratia marcescens NOT DETECTED NOT DETECTED Final   Haemophilus influenzae NOT DETECTED NOT DETECTED Final   Neisseria meningitidis NOT DETECTED NOT DETECTED Final   Pseudomonas aeruginosa NOT DETECTED NOT DETECTED Final   Stenotrophomonas maltophilia NOT DETECTED NOT DETECTED Final   Candida albicans NOT DETECTED NOT DETECTED Final   Candida auris NOT DETECTED NOT DETECTED Final   Candida glabrata NOT DETECTED NOT DETECTED Final   Candida krusei NOT DETECTED NOT DETECTED Final   Candida parapsilosis NOT DETECTED NOT DETECTED Final   Candida tropicalis NOT DETECTED NOT DETECTED Final   Cryptococcus neoformans/gattii NOT DETECTED NOT DETECTED Final   Methicillin resistance mecA/C NOT DETECTED NOT DETECTED Final   Meth resistant mecA/C and MREJ NOT DETECTED NOT DETECTED Final     Comment: Performed at [facility], [address] [place], [place] [address]  Culture, blood (routine x 2)     Status: None   Collection Time: 07/04/20  7:55 AM   Specimen: BLOOD RIGHT HAND  Result Value Ref Range Status   Specimen Description BLOOD RIGHT HAND  Final   Special Requests   Final    BOTTLES DRAWN AEROBIC AND ANAEROBIC Blood Culture adequate volume   Culture   Final    NO GROWTH 5 DAYS Performed at [facility], [address] [place], [place] [address]    Report Status 07/09/2020 FINAL  Final  Culture, blood (routine x 2)     Status: None   Collection Time: 07/06/20  4:26 AM   Specimen: BLOOD  Result Value Ref Range Status   Specimen Description BLOOD RIGHT ANTECUBITAL  Final   Special Requests AEROBIC BOTTLE ONLY Blood Culture adequate volume  Final   Culture   Final    NO GROWTH 5 DAYS Performed at [facility], [address] [place], [place] [address]    Report Status 07/11/2020 FINAL  Final  Culture, blood (routine x 2)  Status: None   Collection Time: 07/06/20  4:29 AM   Specimen: BLOOD  Result Value Ref Range Status   Specimen Description BLOOD LEFT ANTECUBITAL  Final   Special Requests AEROBIC BOTTLE ONLY Blood Culture adequate volume  Final   Culture   Final    NO GROWTH 5 DAYS Performed at [facility], [address] [place], [place] [address]    Report Status 07/11/2020 FINAL  Final         Radiology Studies: VAS [place] UPPER EXTREMITY VENOUS DUPLEX  Result Date: 07/10/2020 UPPER VENOUS STUDY  Indications: Swelling Risk Factors: Trauma. Comparison Study: No prior studies. Performing Technologist: Chanda Busing RVT  Examination Guidelines: A complete evaluation includes B-mode imaging, spectral Doppler, color Doppler, and power Doppler as needed of all accessible portions of each vessel. Bilateral testing is considered an integral part of a complete examination. Limited examinations for reoccurring indications may be  performed as noted.  Right Findings: +----------+------------+---------+-----------+----------+-------+ RIGHT     CompressiblePhasicitySpontaneousPropertiesSummary +----------+------------+---------+-----------+----------+-------+ IJV           Full       Yes       Yes                      +----------+------------+---------+-----------+----------+-------+ Subclavian    Full       Yes       Yes                      +----------+------------+---------+-----------+----------+-------+ Axillary      Full       Yes       Yes                      +----------+------------+---------+-----------+----------+-------+ Brachial      Full       Yes       Yes                      +----------+------------+---------+-----------+----------+-------+ Radial        Full                                          +----------+------------+---------+-----------+----------+-------+ Ulnar         Full                                          +----------+------------+---------+-----------+----------+-------+ Cephalic      None                                   Acute  +----------+------------+---------+-----------+----------+-------+ Basilic       Full                                          +----------+------------+---------+-----------+----------+-------+  Left Findings: +----+------------+---------+-----------+----------+-------+ LEFTCompressiblePhasicitySpontaneousPropertiesSummary +----+------------+---------+-----------+----------+-------+ IJV     Full       Yes       Yes                      +----+------------+---------+-----------+----------+-------+  Summary:  Right: No evidence  of deep vein thrombosis in the upper extremity. Findings consistent with acute superficial vein thrombosis involving the right cephalic vein.  Left: No evidence of thrombosis in the subclavian.  *See table(s) above for measurements and observations.    Preliminary       Scheduled  Meds: . folic acid  1 mg Oral Daily  . melatonin  5 mg Oral QHS  . multivitamin with minerals  1 tablet Oral Daily  . nicotine  21 mg Transdermal Daily  . thiamine  100 mg Oral Daily   Or  . thiamine  100 mg Intravenous Daily   Continuous Infusions: . sodium chloride 10 mL/hr at 07/10/20 2238  .  ceFAZolin (ANCEF) IV 2 g (07/11/20 0606)     LOS: 7 days      Calvert Cantor, MD [facility] Pager: [email or web address] 07/11/2020, 1:00 PM

## 2020-07-11 NOTE — TOC CAGE-AID Note (Signed)
Transition of Care [facility]) - CAGE-AID Screening   Patient Details  Name: Zachary Hurst MRN: [id number] Date of Birth: 11-27-1990  Transition of [facility]) CM/SW Contact:    Carley Hammed, LCSWA Phone Number: 07/11/2020, 3:42 PM   Clinical Narrative:  CSW completed CAGE- AID assessment, pt acknowledged having a drug problem and accepted resources.  CAGE-AID Screening:    Have You Ever Felt You Ought to Cut Down on Your Drinking or Drug Use?: Yes Have People Annoyed You By Critizing Your Drinking Or Drug Use?: Yes Have You Felt Bad Or Guilty About Your Drinking Or Drug Use?: Yes Have You Ever Had a Drink or Used Drugs First Thing In The Morning to Steady Your Nerves or to Get Rid of a Hangover?: Yes CAGE-AID Score: 4  Substance Abuse Education Offered: Yes  Substance abuse interventions: Patient Counseling,Educational Materials

## 2020-07-11 NOTE — Anesthesia Postprocedure Evaluation (Signed)
Anesthesia Post Note  Patient: Zachary Hurst  Procedure(s) Performed: TRANSESOPHAGEAL ECHOCARDIOGRAM (TEE) (N/A ) BUBBLE STUDY     Patient location during evaluation: PACU Anesthesia Type: MAC Level of consciousness: awake and alert and oriented Pain management: pain level controlled Vital Signs Assessment: post-procedure vital signs reviewed and stable Respiratory status: spontaneous breathing, nonlabored ventilation and respiratory function stable Cardiovascular status: stable and blood pressure returned to baseline Postop Assessment: no apparent nausea or vomiting Anesthetic complications: no   No complications documented.  Last Vitals:  Vitals:   07/11/20 1515 07/11/20 1552  BP: 123/74 134/70  Pulse: 77 (!) 101  Resp: 15 18  Temp:  37.1 C  SpO2: 100%     Last Pain:  Vitals:   07/11/20 1552  TempSrc: Oral  PainSc:                  Cleora Karnik A.

## 2020-07-11 NOTE — Interval H&P Note (Signed)
History and Physical Interval Note:  07/11/2020 2:30 PM  Zachary Hurst  has presented today for surgery, with the diagnosis of BACTERIA/IV DRUG USE.  The various methods of treatment have been discussed with the patient and family. After consideration of risks, benefits and other options for treatment, the patient has consented to  Procedure(s): TRANSESOPHAGEAL ECHOCARDIOGRAM (TEE) (N/A) as a surgical intervention.  The patient's history has been reviewed, patient examined, no change in status, stable for surgery.  I have reviewed the patient's chart and labs.  Questions were answered to the patient's satisfaction.     Lyman Balingit A Divina Neale

## 2020-07-11 NOTE — CV Procedure (Signed)
    TRANSESOPHAGEAL ECHOCARDIOGRAM   NAME:  Zachary Hurst    MRN: [id number] DOB:  [birth year]    ADMIT DATE: 07/04/2020  INDICATIONS: Bacteremia  PROCEDURE:   Informed consent was obtained prior to the procedure. The risks, benefits and alternatives for the procedure were discussed and the patient comprehended these risks.  Risks include, but are not limited to, cough, sore throat, vomiting, nausea, somnolence, esophageal and stomach trauma or perforation, bleeding, low blood pressure, aspiration, pneumonia, infection, trauma to the teeth and death.    Procedural time out performed. The oropharynx was anesthetized with topical 1% benzocaine.    Anesthesia was administered by Dr. Noreene Larsson.  The patient was administered a total of Versed 2 mg and Fentanyl 200 mcg, 50 mg lidocaine and 280 mg propofol to achieve and maintain deep sedation.  The patient's heart rate, blood pressure, and oxygen saturation are monitored continuously during the procedure. The period of conscious sedation is 20 minutes, of which I was present face-to-face 100% of this time.   The transesophageal probe was inserted in the esophagus and stomach without difficulty and multiple views were obtained.   COMPLICATIONS:    There were no immediate complications.  KEY FINDINGS:  1. No evidence of valve vegetation or significant regurgitation. 2. Full report to follow. 3. Further management per primary team.   Riley Lam, MD [place]  [facility]  2:55 PM

## 2020-07-11 NOTE — Telephone Encounter (Signed)
Sounds like a good idea to schedule him for a follow up appointment if you can get in touch with him and that is what he wants.

## 2020-07-11 NOTE — TOC Initial Note (Addendum)
Transition of Care [facility]) - Initial/Assessment Note    Patient Details  Name: Zachary Hurst MRN: [id number] Date of Birth: Jul 11, 1990  Transition of Care [facility] CM/SW Contact:    Pollie Friar, RN Phone Number: 07/11/2020, 12:29 PM  Clinical Narrative:                 Pt is from [facility] Pt is not able to return and mother is not allowing him to return to her home.  CM met with the patient and he doesn't have a set plan on where he is going to stay but says he has friends he can stay with. He states his car is in [place] and he has people to get him to his car. CM offered him shelter resources and he refused.  CM will arrange PCP once d/c timing decided.  TOC following.  1610: Pt in need of methadone clinic in [place] area. CM attempt to arrange but clinics closed for the day. CM will call first thing in am to try and get him an appointment.  Pt has transport home when ready.   Expected Discharge Plan: Home/Self Care Barriers to Discharge: Continued Medical Work up   Patient Goals and CMS Choice     Choice offered to / list presented to : Patient  Expected Discharge Plan and Services Expected Discharge Plan: Home/Self Care   Discharge Planning Services: CM Consult                                          Prior Living Arrangements/Services     Patient language and need for interpreter reviewed:: Yes Do you feel safe going back to the place where you live?: Yes            Criminal Activity/Legal Involvement Pertinent to Current Situation/Hospitalization: No - Comment as needed  Activities of Daily Living Home Assistive Devices/Equipment: None ADL Screening (condition at time of admission) Patient's cognitive ability adequate to safely complete daily activities?: No Is the patient deaf or have difficulty hearing?: No Does the patient have difficulty seeing, even when wearing glasses/contacts?: No Does the patient have difficulty concentrating,  remembering, or making decisions?: Yes Patient able to express need for assistance with ADLs?: No Does the patient have difficulty dressing or bathing?: Yes Independently performs ADLs?: No Communication: Independent Dressing (OT): Needs assistance Is this a change from baseline?: Change from baseline, expected to last <3days Grooming: Needs assistance Is this a change from baseline?: Change from baseline, expected to last <3 days Feeding: Independent Bathing: Needs assistance Is this a change from baseline?: Change from baseline, expected to last <3 days Toileting: Needs assistance Is this a change from baseline?: Change from baseline, expected to last <3 days In/Out Bed: Needs assistance Is this a change from baseline?: Change from baseline, expected to last <3 days Walks in Home: Needs assistance Is this a change from baseline?: Change from baseline, expected to last <3 days Does the patient have difficulty walking or climbing stairs?: Yes Weakness of Legs: None Weakness of Arms/Hands: None  Permission Sought/Granted                  Emotional Assessment Appearance:: Appears stated age     Orientation: : Oriented to Self,Oriented to Place,Oriented to  Time,Oriented to Situation Alcohol / Substance Use: Illicit Drugs Psych Involvement: No (comment)  Admission diagnosis:  Acute delirium [  R41.0] Erythema [L53.9] Leucocytosis [D72.829] ARF (acute renal failure) (HCC) [N17.9] Transaminitis [R74.01] Polysubstance abuse (HCC) [F19.10] Acute encephalopathy [G93.40] Acute renal failure, unspecified acute renal failure type ([place]) [P37.9] Acute metabolic encephalopathy [K24.09] Patient Active Problem List   Diagnosis Date Noted  . MSSA bacteremia 07/11/2020  . Hepatitis C antibody positive in blood 07/05/2020  . Acute encephalopathy 07/04/2020  . Acute delirium 07/04/2020  . Elevated LFTs 07/04/2020  . ARF (acute renal failure) ([place]) 07/04/2020   PCP:  Merryl Hacker,  No Pharmacy:   [facility] [place] [place] [address] [place] [address] Phone: [phone] Fax: [phone]     Social Determinants of Health ([place]) Interventions    Readmission Risk Interventions No flowsheet data found.

## 2020-07-11 NOTE — Transfer of Care (Signed)
Immediate Anesthesia Transfer of Care Note  Patient: Tywon Niday  Procedure(s) Performed: TRANSESOPHAGEAL ECHOCARDIOGRAM (TEE) (N/A ) BUBBLE STUDY  Patient Location: PACU and Endoscopy Unit  Anesthesia Type:MAC  Level of Consciousness: drowsy, patient cooperative and responds to stimulation  Airway & Oxygen Therapy: Patient Spontanous Breathing  Post-op Assessment: Report given to RN and Post -op Vital signs reviewed and stable  Post vital signs: Reviewed and stable  Last Vitals:  Vitals Value Taken Time  BP    Temp    Pulse 92 07/11/20 1457  Resp 12 07/11/20 1457  SpO2 100 % 07/11/20 1457  Vitals shown include unvalidated device data.  Last Pain:  Vitals:   07/11/20 1339  TempSrc: Axillary  PainSc: 0-No pain         Complications: No complications documented.

## 2020-07-12 ENCOUNTER — Encounter (HOSPITAL_COMMUNITY): Payer: Self-pay | Admitting: Internal Medicine

## 2020-07-12 ENCOUNTER — Other Ambulatory Visit: Payer: Self-pay | Admitting: Internal Medicine

## 2020-07-12 DIAGNOSIS — R7881 Bacteremia: Secondary | ICD-10-CM

## 2020-07-12 DIAGNOSIS — B9561 Methicillin susceptible Staphylococcus aureus infection as the cause of diseases classified elsewhere: Secondary | ICD-10-CM

## 2020-07-12 MED ORDER — VENLAFAXINE HCL ER 75 MG PO CP24
150.0000 mg | ORAL_CAPSULE | Freq: Every day | ORAL | Status: DC
  Administered 2020-07-12: 150 mg via ORAL
  Filled 2020-07-12: qty 2

## 2020-07-12 MED ORDER — VENLAFAXINE HCL ER 150 MG PO CP24: 150.0000 mg | ORAL_CAPSULE | Freq: Every day | ORAL | 0 refills | Status: AC

## 2020-07-12 MED ORDER — VENLAFAXINE HCL ER 150 MG PO CP24: 150.0000 mg | ORAL_CAPSULE | Freq: Every day | ORAL | 0 refills | Status: DC

## 2020-07-12 MED ORDER — NICOTINE 21 MG/24HR TD PT24: 21.0000 mg | MEDICATED_PATCH | Freq: Every day | TRANSDERMAL | 0 refills | Status: AC

## 2020-07-12 MED FILL — NICOTINE 21 MG/24HR PATCH: 21 | 28 days supply | Qty: 28 | Fill #0

## 2020-07-12 NOTE — Telephone Encounter (Signed)
Call from [facility] - stated pt is not staying locally after being discharged from the hospital; need to cancel appt scheduled 07/19/20 in [place]. Appt canceled per front office.

## 2020-07-12 NOTE — Discharge Summary (Addendum)
Physician Discharge Summary  Zachary Hurst ZOX:[id number] DOB: Mar 12, 1991 DOA: 07/04/2020  PCP: Oneita Hurt, No  Admit date: 07/04/2020 Discharge date: 07/12/2020  Admitted From: home  Disposition:  home   Recommendations for Outpatient Follow-up:  1. Cont to encourage drug and smoking cessation  Home Health:  none  Discharge Condition:  stable   CODE STATUS:  Full code   Diet recommendation:  regular Consultations:  ID  Renal  Procedures/Studies: . TTE and TEE   Discharge Diagnoses:  Principal Problem:   MSSA bacteremia with Sepsis Active Problems:   Acute encephalopathy   Acute delirium   Elevated LFTs   Rhabdomyolysis   ARF (acute renal failure) (HCC)   Hepatitis C antibody positive in blood     Brief Summary: Zachary Hurst is a 30 year old male with a history of heroin and cocaine abuse who was brought to the hospital by EMS with severe acute confusion his girlfriend called them.  According to the girlfriend the patient had been using heroin and cocaine that day and subsequently became extremely aggressive yelling and screaming and flailing his extremities. Further history obtained from the patient's mother.  She states that the patient has been in a drug rehab center for 2 months and was released on Friday on a pass for the weekend.  He visited his mother on Saturday who suspected that he may be high but did not notice any track marks at that time. The patient's mother further states that about 2 years ago the patient was admitted to [facility] after a similar episode of severe agitation when he used heroin.  She states that IV Ativan was ineffective in reducing his agitation and he was eventually placed in a medically induced coma.  He recuperated and was discharged within 6 days of being admitted.  He has been admitted to an inpatient rehab facility on 3 separate occasions but has relapsed each time.  Given Geodon in the ED for his severe agitation and this helped  him.  Hospital Course:  Principal Problem:   Acute encephalopathy - multifactorial-  Bacteremia, AKI, he used heroin which may have been mixed with fentanyl and other substances prior to coming to the hospital along with cocaine a few days prior to admission -resolved as of 2/18 - very oriented now, out of restraints and able to understand and comply with the plan of treatment  Active Problems:  MSSA bacteremia with tachycardia and leukocytosis- sepsis POA - has IV drug abuse -  snorts cocaine and may have some breakdown of the nasal mucosa -he also has numerous scabs on his feet and legs which may be entry points for bacteria - treated with cefazolin in the hospital -2D echo reveals no vegetations  -Appreciate ID consult- TEE on 2/21 negative for endocarditis - repeat cultures are negative - Given Oritavancin on 2/21 - further recommended by ID to take 2 wks of Zyvox which have been prescribed  Right arm swelling/ superficial venous thrombosis of right cephalic vein - swelling of anti cubital area, arm and forearm with tenderness- ? If IV infiltrated-  - 2/10> obtained a venous duplex to r/u DVT. No DVT but has SVT as mentioned- heat packs ordered  Drug abuse -With IV Heroin and snorting Cocaine  -Mother states that does not have any more money to pay for inpatient rehab facilities that she has paid for these facilities on 3 different occasions -He is a candidate for Suboxone or methadone and our [facility] team has set him up for an  appointment for a Clinic in [place] where his mother lives - Mother declines to let him stay with her after hospitalization and he has been given a list of shelters as well  Rhabdomyolysis with AKI and metabolic acidosis -Suspect his AKI is related to rhabdomyolysis and some dehydration -His rhabdomyolysis may be related to the severe agitation and flailing of extremities noted by his girlfriend who sent a video of this to his mother -CK levels and Cr   improved-     Elevated LFTs -Likely related to rhabdomyolysis and infection -  he does not drink alcohol -AST 1085 and ALT 443- also improving    Hepatitis C antibody positive in blood  Thrombocytopenia- acute - suspect this was due to infection  - platelets steadily improving-   Nicotine abuse - counseled and prescription given for nicotine patches  Normocytic anemia - outpt follow up recommended   Discharge Exam: Vitals:   07/12/20 0322 07/12/20 0744  BP: 115/74 114/69  Pulse: 88 86  Resp: 18 18  Temp: 98.8 F (37.1 C) 98.3 F (36.8 C)  SpO2: 100% 100%   Vitals:   07/11/20 1943 07/11/20 2318 07/12/20 0322 07/12/20 0744  BP: 123/81 130/76 115/74 114/69  Pulse: (!) 105 97 88 86  Resp: 18 18 18 18   Temp: 98.6 F (37 C) 99.1 F (37.3 C) 98.8 F (37.1 C) 98.3 F (36.8 C)  TempSrc: Oral Oral Oral Oral  SpO2: 100% 98% 100% 100%  Weight:      Height:        General: Pt is alert, awake, not in acute distress Cardiovascular: RRR, S1/S2 +, no rubs, no gallops Respiratory: CTA bilaterally, no wheezing, no rhonchi Abdominal: Soft, NT, ND, bowel sounds + Extremities: no edema, no cyanosis   Discharge Instructions  Discharge Instructions    Diet - low sodium heart healthy   Complete by: As directed    Increase activity slowly   Complete by: As directed      Allergies as of 07/12/2020   No Known Allergies     Medication List    STOP taking these medications   naltrexone 50 MG tablet Commonly known as: DEPADE   QUEtiapine 50 MG tablet Commonly known as: SEROQUEL     TAKE these medications   ibuprofen 600 MG tablet Commonly known as: ADVIL Take 600 mg by mouth every 6 (six) hours as needed for cramping or headache.   linezolid 600 MG tablet Commonly known as: ZYVOX Take 1 tablet (600 mg total) by mouth 2 (two) times daily for 14 days.   nicotine 21 mg/24hr patch Commonly known as: NICODERM CQ - dosed in mg/24 hours Place 1 patch (21 mg total)  onto the skin daily.   venlafaxine XR 150 MG 24 hr capsule Commonly known as: EFFEXOR-XR Take 1 capsule (150 mg total) by mouth daily with breakfast.       Follow-up Information    [facility] Follow up on 07/18/2020.   Why: Your appointment time is 6:45 am. Please arrive early and bring a picture ID and d/c paperwork. You will have a drug test at the appointment. Your copay is $130.  Contact information: [facility] [address] ,  [place]   [place] Fax: [phone]             No Known Allergies    CT HEAD WO CONTRAST  Result Date: 07/06/2020 CLINICAL DATA:  Mental status changes  of unknown cause EXAM: CT HEAD WITHOUT CONTRAST TECHNIQUE: Contiguous axial images were obtained from the base of the skull through the vertex without intravenous contrast. Sagittal and coronal MPR images reconstructed from axial data set. COMPARISON:  None FINDINGS: Brain: Normal ventricular morphology. No midline shift or mass effect. Normal appearance of brain parenchyma. No intracranial hemorrhage, mass lesion, or evidence of acute infarction. No extra-axial fluid collections. Vascular: No hyperdense vessels Skull: Intact Sinuses/Orbits: Clear Other: N/A IMPRESSION: Normal exam. Electronically Signed   By: Ulyses Southward M.D.   On: 07/06/2020 15:00   DG CHEST PORT 1 VIEW  Result Date: 07/05/2020 CLINICAL DATA:  Metabolic encephalopathy.  Drug use. EXAM: PORTABLE CHEST 1 VIEW COMPARISON:  Yesterday FINDINGS: Normal heart size and mediastinal contours. No acute infiltrate or edema. No effusion or pneumothorax. No acute osseous findings. Thoracic dextrocurvature. IMPRESSION: Negative portable chest. Electronically Signed   By: Marnee Spring M.D.   On: 07/05/2020 07:30   DG CHEST PORT 1 VIEW  Result Date: 07/04/2020 CLINICAL DATA:  Leukocytosis.  Heroin and cocaine use. EXAM: PORTABLE CHEST 1 VIEW COMPARISON:  CT renal 07/04/2020 FINDINGS:  The heart size and mediastinal contours are within normal limits. No focal consolidation. No pulmonary edema. No pleural effusion. No pneumothorax. No acute osseous abnormality. IMPRESSION: No active disease. Electronically Signed   By: Tish Frederickson M.D.   On: 07/04/2020 06:48   DG Foot 2 Views Right  Result Date: 07/05/2020 CLINICAL DATA:  30 year old male with right foot erythema. EXAM: RIGHT FOOT - 2 VIEW COMPARISON:  None. FINDINGS: There is no evidence of fracture or dislocation. There is no evidence of arthropathy or other focal bone abnormality. Os vesalinum is noted. Soft tissues are unremarkable. No radiopaque foreign bodies. IMPRESSION: No acute fracture or malalignment. The soft tissues are within normal limits. Electronically Signed   By: Marliss Coots MD   On: 07/05/2020 08:25   ECHOCARDIOGRAM COMPLETE  Result Date: 07/05/2020    ECHOCARDIOGRAM REPORT   Patient Name:   YOSGAR DEMIRJIAN Date of Exam: 07/05/2020 Medical Rec #:  [id number]        Height: Accession #:    [id number]       Weight: Date of Birth:  03-15-1991        BSA: Patient Age:    30 years         BP:           139/81 mmHg Patient Gender: M                HR:           115 bpm. Exam Location:  Inpatient Procedure: 2D Echo, Cardiac Doppler and Color Doppler Indications:    Bacteremia R78.81  History:        Patient has no prior history of Echocardiogram examinations.  Sonographer:    Eulah Pont RDCS Referring Phys: [id number] [facility] IMPRESSIONS  1. Left ventricular ejection fraction, by estimation, is 60 to 65%. The left ventricle has normal function. The left ventricle has no regional wall motion abnormalities. Left ventricular diastolic parameters were normal.  2. Right ventricular systolic function is normal. The right ventricular size is normal.  3. The mitral valve is normal in structure. No evidence of mitral valve regurgitation. No evidence of mitral stenosis.  4. The aortic valve is normal in structure. Aortic  valve regurgitation is not visualized. No aortic stenosis is present.  5. The inferior vena cava is normal in size with greater than 50%  respiratory variability, suggesting right atrial pressure of 3 mmHg. Conclusion(s)/Recommendation(s): No evidence of valvular vegetations on this transthoracic echocardiogram. Would recommend a transesophageal echocardiogram to exclude infective endocarditis if clinically indicated. FINDINGS  Left Ventricle: Left ventricular ejection fraction, by estimation, is 60 to 65%. The left ventricle has normal function. The left ventricle has no regional wall motion abnormalities. The left ventricular internal cavity size was normal in size. There is  no left ventricular hypertrophy. Left ventricular diastolic parameters were normal. Normal left ventricular filling pressure. Right Ventricle: The right ventricular size is normal. No increase in right ventricular wall thickness. Right ventricular systolic function is normal. Left Atrium: Left atrial size was normal in size. Right Atrium: Right atrial size was normal in size. Pericardium: There is no evidence of pericardial effusion. Mitral Valve: The mitral valve is normal in structure. No evidence of mitral valve regurgitation. No evidence of mitral valve stenosis. Tricuspid Valve: The tricuspid valve is normal in structure. Tricuspid valve regurgitation is not demonstrated. No evidence of tricuspid stenosis. Aortic Valve: The aortic valve is normal in structure. Aortic valve regurgitation is not visualized. No aortic stenosis is present. Pulmonic Valve: The pulmonic valve was normal in structure. Pulmonic valve regurgitation is not visualized. No evidence of pulmonic stenosis. Aorta: The aortic root is normal in size and structure. Venous: The inferior vena cava is normal in size with greater than 50% respiratory variability, suggesting right atrial pressure of 3 mmHg. IAS/Shunts: No atrial level shunt detected by color flow Doppler.  LEFT  VENTRICLE PLAX 2D LVIDd:         4.60 cm  Diastology LVIDs:         2.60 cm  LV e' medial:    15.70 cm/s LV PW:         1.10 cm  LV E/e' medial:  6.1 LV IVS:        1.00 cm  LV e' lateral:   14.50 cm/s LVOT diam:     2.30 cm  LV E/e' lateral: 6.6 LV SV:         120 LVOT Area:     4.15 cm  RIGHT VENTRICLE RV S prime:     17.50 cm/s TAPSE (M-mode): 2.7 cm LEFT ATRIUM             RIGHT ATRIUM LA diam:        2.50 cm RA Area:     13.00 cm LA Vol (A2C):   28.8 ml RA Volume:   28.40 ml LA Vol (A4C):   30.4 ml LA Biplane Vol: 30.6 ml  AORTIC VALVE LVOT Vmax:   132.00 cm/s LVOT Vmean:  102.000 cm/s LVOT VTI:    0.290 m  AORTA Ao Root diam: 3.70 cm Ao Asc diam:  3.00 cm MITRAL VALVE MV Area (PHT): 3.37 cm    SHUNTS MV Decel Time: 225 msec    Systemic VTI:  0.29 m MV E velocity: 95.10 cm/s  Systemic Diam: 2.30 cm MV A velocity: 84.90 cm/s MV E/A ratio:  1.12 Mihai Croitoru MD Electronically signed by Thurmon Fair MD Signature Date/Time: 07/05/2020/4:53:37 PM    Final    CT RENAL STONE STUDY  Result Date: 07/04/2020 CLINICAL DATA:  Renal parenchymal disease with hematuria. EXAM: CT ABDOMEN AND PELVIS WITHOUT CONTRAST TECHNIQUE: Multidetector CT imaging of the abdomen and pelvis was performed following the standard protocol without IV contrast. COMPARISON:  None. FINDINGS: Lower chest: Right lower lobe subsegmental atelectasis. No acute abnormality. Hepatobiliary: No focal liver abnormality.  No gallstones, gallbladder wall thickening, or pericholecystic fluid. No biliary dilatation. Pancreas: No focal lesion. Normal pancreatic contour. No surrounding inflammatory changes. No main pancreatic ductal dilatation. Spleen: Normal in size without focal abnormality. Adrenals/Urinary Tract: No adrenal nodule bilaterally. Slightly more full/enlgared right renal parenchyma compared to the left. No definite perinephric stranding. Limited evaluation for venous fluid collection on this noncontrast study. No nephrolithiasis, no  hydronephrosis, and no contour-deforming renal mass. No ureterolithiasis or hydroureter. The urinary bladder is unremarkable. Stomach/Bowel: Stomach is within normal limits. No evidence of bowel wall thickening or dilatation. Appendix appears normal. Vascular/Lymphatic: No significant vascular findings are present. No enlarged abdominal or pelvic lymph nodes. Reproductive: Prostate is unremarkable. Other: No intraperitoneal free fluid. No intraperitoneal free gas. No organized fluid collection. Musculoskeletal: No acute or significant osseous findings. IMPRESSION: 1. Slightly more prominent/enlarged right renal parenchyma compared to the left-finding could possibly represent pyelonephritis. Limited evaluation on this noncontrast study. Consider renal ultrasound for further evaluation. 2. No nephroureterolithiasis bilaterally. Electronically Signed   By: Tish Frederickson M.D.   On: 07/04/2020 06:40   ECHO TEE  Result Date: 07/11/2020    TRANSESOPHOGEAL ECHO REPORT   Patient Name:   AGUSTIN SWATEK Date of Exam: 07/11/2020 Medical Rec #:  [id number]        Height:       73.0 in Accession #:    [id number]       Weight:       212.3 lb Date of Birth:  1991/01/03        BSA:          2.207 m Patient Age:    30 years         BP:           116/63 mmHg Patient Gender: M                HR:           89 bpm. Exam Location:  Inpatient Procedure: Transesophageal Echo, Cardiac Doppler, Color Doppler and Saline            Contrast Bubble Study Indications:     Bacteremia R78.81  History:         Patient has prior history of Echocardiogram examinations, most                  recent 07/05/2020.  Sonographer:     Elmarie Shiley Dance Referring Phys:  [id number] Christell Constant Diagnosing Phys: Riley Lam MD PROCEDURE: The transesophogeal probe was passed without difficulty through the esophogus of the patient. Sedation performed by different physician. The patient was monitored while under deep sedation. Anesthestetic  sedation was provided intravenously by Anesthesiology: 291.  of Propofol,  of Lidocaine. Patients was under conscious sedation during this procedure. Anesthetic administered: of Fentanyl, 2.0mg  of Versed. The patient developed no complications during the procedure. IMPRESSIONS  1. Left ventricular ejection fraction, by estimation, is 60 to 65%. The left ventricle has normal function.  2. Right ventricular systolic function is normal. The right ventricular size is normal.  3. Visually appearance is of PFO, however agitated saline contrast bubble study was negative, with no evidence of any interatrial shunt by color or bubble, or Valsalva.  4. No left atrial/left atrial appendage thrombus was detected. The LAA emptying velocity was 40 cm/s.  5. The mitral valve is normal in structure. Trivial mitral valve regurgitation.  6. The aortic valve is normal in structure. Aortic valve regurgitation is not visualized. Comparison(s): A prior  study was performed on 07/05/2020. No significant change. FINDINGS  Left Ventricle: Left ventricular ejection fraction, by estimation, is 60 to 65%. The left ventricle has normal function. The left ventricular internal cavity size was normal in size. Right Ventricle: The right ventricular size is normal. Right vetricular wall thickness was not well visualized. Right ventricular systolic function is normal. Left Atrium: Left atrial size was normal in size. No left atrial/left atrial appendage thrombus was detected. The LAA emptying velocity was 40 cm/s. Right Atrium: Right atrial size was normal in size. Pericardium: There is no evidence of pericardial effusion. Mitral Valve: The mitral valve is normal in structure. Trivial mitral valve regurgitation. Tricuspid Valve: The tricuspid valve is normal in structure. Tricuspid valve regurgitation is trivial. Aortic Valve: The aortic valve is normal in structure. Aortic valve regurgitation is not visualized. Pulmonic Valve: The  pulmonic valve was grossly normal. Pulmonic valve regurgitation is not visualized. Aorta: The aortic root is normal in size and structure. IAS/Shunts: No atrial level shunt detected by color flow Doppler. Agitated saline contrast was given intravenously to evaluate for intracardiac shunting. Agitated saline contrast bubble study was negative, with no evidence of any interatrial shunt. Riley Lam MD Electronically signed by Riley Lam MD Signature Date/Time: 07/11/2020/4:58:31 PM    Final    VAS [place] UPPER EXTREMITY VENOUS DUPLEX  Result Date: 07/11/2020 UPPER VENOUS STUDY  Indications: Swelling Risk Factors: Trauma. Comparison Study: No prior studies. Performing Technologist: Chanda Busing RVT  Examination Guidelines: A complete evaluation includes B-mode imaging, spectral Doppler, color Doppler, and power Doppler as needed of all accessible portions of each vessel. Bilateral testing is considered an integral part of a complete examination. Limited examinations for reoccurring indications may be performed as noted.  Right Findings: +----------+------------+---------+-----------+----------+-------+ RIGHT     CompressiblePhasicitySpontaneousPropertiesSummary +----------+------------+---------+-----------+----------+-------+ IJV           Full       Yes       Yes                      +----------+------------+---------+-----------+----------+-------+ Subclavian    Full       Yes       Yes                      +----------+------------+---------+-----------+----------+-------+ Axillary      Full       Yes       Yes                      +----------+------------+---------+-----------+----------+-------+ Brachial      Full       Yes       Yes                      +----------+------------+---------+-----------+----------+-------+ Radial        Full                                          +----------+------------+---------+-----------+----------+-------+ Ulnar          Full                                          +----------+------------+---------+-----------+----------+-------+ Cephalic      None  Acute  +----------+------------+---------+-----------+----------+-------+ Basilic       Full                                          +----------+------------+---------+-----------+----------+-------+  Left Findings: +----+------------+---------+-----------+----------+-------+ LEFTCompressiblePhasicitySpontaneousPropertiesSummary +----+------------+---------+-----------+----------+-------+ IJV     Full       Yes       Yes                      +----+------------+---------+-----------+----------+-------+  Summary:  Right: No evidence of deep vein thrombosis in the upper extremity. Findings consistent with acute superficial vein thrombosis involving the right cephalic vein.  Left: No evidence of thrombosis in the subclavian.  *See table(s) above for measurements and observations.  Diagnosing physician: Fabienne Bruns MD Electronically signed by Fabienne Bruns MD on 07/11/2020 at 5:53:22 PM.    Final       The results of significant diagnostics from this hospitalization (including imaging, microbiology, ancillary and laboratory) are listed below for reference.     Microbiology: Recent Results (from the past 240 hour(s))  Resp Panel by RT-PCR (Flu A&B, Covid) Nasopharyngeal Swab     Status: None   Collection Time: 07/04/20  3:08 AM   Specimen: Nasopharyngeal Swab; Nasopharyngeal(NP) swabs in vial transport medium  Result Value Ref Range Status   SARS Coronavirus 2 by RT PCR NEGATIVE NEGATIVE Final    Comment: (NOTE) SARS-CoV-2 target nucleic acids are NOT DETECTED.  The SARS-CoV-2 RNA is generally detectable in upper respiratory specimens during the acute phase of infection. The lowest concentration of SARS-CoV-2 viral copies this assay can detect is 138 copies/mL. A negative result does not  preclude SARS-Cov-2 infection and should not be used as the sole basis for treatment or other patient management decisions. A negative result may occur with  improper specimen collection/handling, submission of specimen other than nasopharyngeal swab, presence of viral mutation(s) within the areas targeted by this assay, and inadequate number of viral copies(<138 copies/mL). A negative result must be combined with clinical observations, patient history, and epidemiological information. The expected result is Negative.  Fact Sheet for Patients:  BloggerCourse.com  Fact Sheet for Healthcare Providers:  SeriousBroker.it  This test is no t yet approved or cleared by the [place] FDA and  has been authorized for detection and/or diagnosis of SARS-CoV-2 by FDA under an Emergency Use Authorization (EUA). This EUA will remain  in effect (meaning this test can be used) for the duration of the COVID-19 declaration under Section 564(b)(1) of the Act, 21 U.S.C.section 360bbb-3(b)(1), unless the authorization is terminated  or revoked sooner.       Influenza A by PCR NEGATIVE NEGATIVE Final   Influenza B by PCR NEGATIVE NEGATIVE Final    Comment: (NOTE) The Xpert Xpress SARS-CoV-2/FLU/RSV plus assay is intended as an aid in the diagnosis of influenza from Nasopharyngeal swab specimens and should not be used as a sole basis for treatment. Nasal washings and aspirates are unacceptable for Xpert Xpress SARS-CoV-2/FLU/RSV testing.  Fact Sheet for Patients: BloggerCourse.com  Fact Sheet for Healthcare Providers: SeriousBroker.it  This test is not yet approved or cleared by the [place] FDA and has been authorized for detection and/or diagnosis of SARS-CoV-2 by FDA under an Emergency Use Authorization (EUA). This EUA will remain in effect (meaning this test can be used) for the  duration of the COVID-19 declaration under Section 564(b)(1)  of the Act, 21 U.S.C. section 360bbb-3(b)(1), unless the authorization is terminated or revoked.  Performed at [facility], [address] [place], [place] [address]   Culture, blood (routine x 2)     Status: Abnormal   Collection Time: 07/04/20  6:13 AM   Specimen: BLOOD  Result Value Ref Range Status   Specimen Description BLOOD RIGHT ANTECUBITAL  Final   Special Requests   Final    BOTTLES DRAWN AEROBIC AND ANAEROBIC Blood Culture adequate volume   Culture  Setup Time   Final    GRAM POSITIVE COCCI IN CLUSTERS IN BOTH AEROBIC AND ANAEROBIC BOTTLES CRITICAL RESULT CALLED TO, READ BACK BY AND VERIFIED WITH: Melven Sartorius [facility] 07/04/20 2316 JDW Performed at [facility], [address], [place], [place] [address]    Culture STAPHYLOCOCCUS AUREUS (A)  Final   Report Status 07/09/2020 FINAL  Final   Organism ID, Bacteria STAPHYLOCOCCUS AUREUS  Final      Susceptibility   Staphylococcus aureus - MIC*    CIPROFLOXACIN <=0.5 SENSITIVE Sensitive     ERYTHROMYCIN >=8 RESISTANT Resistant     GENTAMICIN <=0.5 SENSITIVE Sensitive     OXACILLIN <=0.25 SENSITIVE Sensitive     TETRACYCLINE <=1 SENSITIVE Sensitive     VANCOMYCIN <=0.5 SENSITIVE Sensitive     TRIMETH/SULFA <=10 SENSITIVE Sensitive     CLINDAMYCIN RESISTANT Resistant     RIFAMPIN <=0.5 SENSITIVE Sensitive     Inducible Clindamycin POSITIVE Resistant     * STAPHYLOCOCCUS AUREUS  Blood Culture ID Panel (Reflexed)     Status: Abnormal   Collection Time: 07/04/20  6:13 AM  Result Value Ref Range Status   Enterococcus faecalis NOT DETECTED NOT DETECTED Final   Enterococcus Faecium NOT DETECTED NOT DETECTED Final   Listeria monocytogenes NOT DETECTED NOT DETECTED Final   Staphylococcus species DETECTED (A) NOT DETECTED Final    Comment: CRITICAL RESULT CALLED TO, READ BACK BY AND VERIFIED WITH: J LEDFORD PHARMD 07/04/20 2316 JDW    Staphylococcus aureus  (BCID) DETECTED (A) NOT DETECTED Final    Comment: Methicillin (oxacillin) susceptible Staphylococcus aureus (MSSA). Preferred therapy is anti staphylococcal beta lactam antibiotic (Cefazolin or Nafcillin), unless clinically contraindicated. CRITICAL RESULT CALLED TO, READ BACK BY AND VERIFIED WITH: J LEDFORD [facility] 07/04/20 2316 JDW    Staphylococcus epidermidis DETECTED (A) NOT DETECTED Final    Comment: CRITICAL RESULT CALLED TO, READ BACK BY AND VERIFIED WITH: J LEDFORD PHARMD 07/04/20 2316 JDW    Staphylococcus lugdunensis NOT DETECTED NOT DETECTED Final   Streptococcus species DETECTED (A) NOT DETECTED Final    Comment: Not Enterococcus species, Streptococcus agalactiae, Streptococcus pyogenes, or Streptococcus pneumoniae. CRITICAL RESULT CALLED TO, READ BACK BY AND VERIFIED WITH: J LEDFORD PHARMD 07/04/20 2316 JDW    Streptococcus agalactiae NOT DETECTED NOT DETECTED Final   Streptococcus pneumoniae NOT DETECTED NOT DETECTED Final   Streptococcus pyogenes NOT DETECTED NOT DETECTED Final   A.calcoaceticus-baumannii NOT DETECTED NOT DETECTED Final   Bacteroides fragilis NOT DETECTED NOT DETECTED Final   Enterobacterales NOT DETECTED NOT DETECTED Final   Enterobacter cloacae complex NOT DETECTED NOT DETECTED Final   Escherichia coli NOT DETECTED NOT DETECTED Final   Klebsiella aerogenes NOT DETECTED NOT DETECTED Final   Klebsiella oxytoca NOT DETECTED NOT DETECTED Final   Klebsiella pneumoniae NOT DETECTED NOT DETECTED Final   Proteus species NOT DETECTED NOT DETECTED Final   Salmonella species NOT DETECTED NOT DETECTED Final   Serratia marcescens NOT DETECTED NOT DETECTED  Final   Haemophilus influenzae NOT DETECTED NOT DETECTED Final   Neisseria meningitidis NOT DETECTED NOT DETECTED Final   Pseudomonas aeruginosa NOT DETECTED NOT DETECTED Final   Stenotrophomonas maltophilia NOT DETECTED NOT DETECTED Final   Candida albicans NOT DETECTED NOT DETECTED Final   Candida auris NOT  DETECTED NOT DETECTED Final   Candida glabrata NOT DETECTED NOT DETECTED Final   Candida krusei NOT DETECTED NOT DETECTED Final   Candida parapsilosis NOT DETECTED NOT DETECTED Final   Candida tropicalis NOT DETECTED NOT DETECTED Final   Cryptococcus neoformans/gattii NOT DETECTED NOT DETECTED Final   Methicillin resistance mecA/C NOT DETECTED NOT DETECTED Final   Meth resistant mecA/C and MREJ NOT DETECTED NOT DETECTED Final    Comment: Performed at [facility], [address] [place], [place] [address]  Culture, blood (routine x 2)     Status: None   Collection Time: 07/04/20  7:55 AM   Specimen: BLOOD RIGHT HAND  Result Value Ref Range Status   Specimen Description BLOOD RIGHT HAND  Final   Special Requests   Final    BOTTLES DRAWN AEROBIC AND ANAEROBIC Blood Culture adequate volume   Culture   Final    NO GROWTH 5 DAYS Performed at [facility], [address] [place], [place] [address]    Report Status 07/09/2020 FINAL  Final  Culture, blood (routine x 2)     Status: None   Collection Time: 07/06/20  4:26 AM   Specimen: BLOOD  Result Value Ref Range Status   Specimen Description BLOOD RIGHT ANTECUBITAL  Final   Special Requests AEROBIC BOTTLE ONLY Blood Culture adequate volume  Final   Culture   Final    NO GROWTH 5 DAYS Performed at [facility], [address] [place], [place] [address]    Report Status 07/11/2020 FINAL  Final  Culture, blood (routine x 2)     Status: None   Collection Time: 07/06/20  4:29 AM   Specimen: BLOOD  Result Value Ref Range Status   Specimen Description BLOOD LEFT ANTECUBITAL  Final   Special Requests AEROBIC BOTTLE ONLY Blood Culture adequate volume  Final   Culture   Final    NO GROWTH 5 DAYS Performed at [facility], [address] [place], [place] [address]    Report Status 07/11/2020 FINAL  Final     Labs: BNP (last 3 results) No results for input(s): BNP in the last 8760 hours. Basic Metabolic  Panel: Recent Labs  Lab 07/07/20 0805 07/08/20 0329 07/08/20 1648 07/09/20 1054 07/10/20 1406  NA 141 139 142 141 141  K 3.4* 3.6 4.1 4.3 4.2  CL 110 108 108 106 106  CO2 22 24 23 24 28   GLUCOSE 96 93 111* 99 112*  BUN 37* 25* 18 14 13   CREATININE 1.92* 1.55* 1.45* 1.28* 1.18  CALCIUM 8.0* 8.5* 8.7* 8.9 8.9  PHOS  --  2.9  --   --   --    Liver Function Tests: Recent Labs  Lab 07/07/20 0805 07/08/20 0329 07/08/20 1648 07/09/20 1054 07/10/20 1406  AST 398*  --  205* 141* 71*  ALT 173*  --  79* 57* 37  ALKPHOS 62  --  57 53 49  BILITOT 1.1  --  1.1 0.7 0.5  PROT 5.1*  --  5.2* 5.8* 5.8*  ALBUMIN 2.7* 2.7* 2.8* 3.0* 3.1*   No results for input(s): LIPASE, AMYLASE in the last 168  hours. No results for input(s): AMMONIA in the last 168 hours. CBC: Recent Labs  Lab 07/07/20 0805 07/09/20 0457  WBC 9.8 10.4  HGB 9.1* 10.1*  HCT 27.8* 29.2*  MCV 90.8 89.6  PLT 115* 132*   Cardiac Enzymes: Recent Labs  Lab 07/06/20 0421 07/07/20 0352 07/07/20 0805 07/08/20 0329  CKTOTAL 15,385* 6,094* 4,884* 2,903*   BNP: Invalid input(s): POCBNP CBG: Recent Labs  Lab 07/07/20 1627 07/07/20 2346 07/08/20 0631 07/08/20 1253 07/08/20 1612  GLUCAP 101* 82 119* 90 98   D-Dimer No results for input(s): DDIMER in the last 72 hours. Hgb A1c No results for input(s): HGBA1C in the last 72 hours. Lipid Profile No results for input(s): CHOL, HDL, LDLCALC, TRIG, CHOLHDL, LDLDIRECT in the last 72 hours. Thyroid function studies No results for input(s): TSH, T4TOTAL, T3FREE, THYROIDAB in the last 72 hours.  Invalid input(s): FREET3 Anemia work up No results for input(s): VITAMINB12, FOLATE, FERRITIN, TIBC, IRON, RETICCTPCT in the last 72 hours. Urinalysis    Component Value Date/Time   COLORURINE YELLOW 07/05/2020 1400   APPEARANCEUR HAZY (A) 07/05/2020 1400   LABSPEC 1.010 07/05/2020 1400   PHURINE 5.0 07/05/2020 1400   GLUCOSEU NEGATIVE 07/05/2020 1400   HGBUR LARGE  (A) 07/05/2020 1400   BILIRUBINUR NEGATIVE 07/05/2020 1400   KETONESUR 5 (A) 07/05/2020 1400   PROTEINUR 30 (A) 07/05/2020 1400   NITRITE NEGATIVE 07/05/2020 1400   LEUKOCYTESUR NEGATIVE 07/05/2020 1400   Sepsis Labs Invalid input(s): PROCALCITONIN,  WBC,  LACTICIDVEN Microbiology Recent Results (from the past 240 hour(s))  Resp Panel by RT-PCR (Flu A&B, Covid) Nasopharyngeal Swab     Status: None   Collection Time: 07/04/20  3:08 AM   Specimen: Nasopharyngeal Swab; Nasopharyngeal(NP) swabs in vial transport medium  Result Value Ref Range Status   SARS Coronavirus 2 by RT PCR NEGATIVE NEGATIVE Final    Comment: (NOTE) SARS-CoV-2 target nucleic acids are NOT DETECTED.  The SARS-CoV-2 RNA is generally detectable in upper respiratory specimens during the acute phase of infection. The lowest concentration of SARS-CoV-2 viral copies this assay can detect is 138 copies/mL. A negative result does not preclude SARS-Cov-2 infection and should not be used as the sole basis for treatment or other patient management decisions. A negative result may occur with  improper specimen collection/handling, submission of specimen other than nasopharyngeal swab, presence of viral mutation(s) within the areas targeted by this assay, and inadequate number of viral copies(<138 copies/mL). A negative result must be combined with clinical observations, patient history, and epidemiological information. The expected result is Negative.  Fact Sheet for Patients:  BloggerCourse.com  Fact Sheet for Healthcare Providers:  SeriousBroker.it  This test is no t yet approved or cleared by the [place] FDA and  has been authorized for detection and/or diagnosis of SARS-CoV-2 by FDA under an Emergency Use Authorization (EUA). This EUA will remain  in effect (meaning this test can be used) for the duration of the COVID-19 declaration under Section 564(b)(1) of  the Act, 21 U.S.C.section 360bbb-3(b)(1), unless the authorization is terminated  or revoked sooner.       Influenza A by PCR NEGATIVE NEGATIVE Final   Influenza B by PCR NEGATIVE NEGATIVE Final    Comment: (NOTE) The Xpert Xpress SARS-CoV-2/FLU/RSV plus assay is intended as an aid in the diagnosis of influenza from Nasopharyngeal swab specimens and should not be used as a sole basis for treatment. Nasal washings and aspirates are unacceptable for Xpert Xpress SARS-CoV-2/FLU/RSV testing.  Fact Sheet  for Patients: BloggerCourse.com  Fact Sheet for Healthcare Providers: SeriousBroker.it  This test is not yet approved or cleared by the [place] FDA and has been authorized for detection and/or diagnosis of SARS-CoV-2 by FDA under an Emergency Use Authorization (EUA). This EUA will remain in effect (meaning this test can be used) for the duration of the COVID-19 declaration under Section 564(b)(1) of the Act, 21 U.S.C. section 360bbb-3(b)(1), unless the authorization is terminated or revoked.  Performed at [facility], [address] [place], [place] [address]   Culture, blood (routine x 2)     Status: Abnormal   Collection Time: 07/04/20  6:13 AM   Specimen: BLOOD  Result Value Ref Range Status   Specimen Description BLOOD RIGHT ANTECUBITAL  Final   Special Requests   Final    BOTTLES DRAWN AEROBIC AND ANAEROBIC Blood Culture adequate volume   Culture  Setup Time   Final    GRAM POSITIVE COCCI IN CLUSTERS IN BOTH AEROBIC AND ANAEROBIC BOTTLES CRITICAL RESULT CALLED TO, READ BACK BY AND VERIFIED WITH: Melven [facility] 07/04/20 2316 JDW Performed at [facility], [address], [place], [place] [address]    Culture STAPHYLOCOCCUS AUREUS (A)  Final   Report Status 07/09/2020 FINAL  Final   Organism ID, Bacteria STAPHYLOCOCCUS AUREUS  Final      Susceptibility   Staphylococcus aureus - MIC*    CIPROFLOXACIN  <=0.5 SENSITIVE Sensitive     ERYTHROMYCIN >=8 RESISTANT Resistant     GENTAMICIN <=0.5 SENSITIVE Sensitive     OXACILLIN <=0.25 SENSITIVE Sensitive     TETRACYCLINE <=1 SENSITIVE Sensitive     VANCOMYCIN <=0.5 SENSITIVE Sensitive     TRIMETH/SULFA <=10 SENSITIVE Sensitive     CLINDAMYCIN RESISTANT Resistant     RIFAMPIN <=0.5 SENSITIVE Sensitive     Inducible Clindamycin POSITIVE Resistant     * STAPHYLOCOCCUS AUREUS  Blood Culture ID Panel (Reflexed)     Status: Abnormal   Collection Time: 07/04/20  6:13 AM  Result Value Ref Range Status   Enterococcus faecalis NOT DETECTED NOT DETECTED Final   Enterococcus Faecium NOT DETECTED NOT DETECTED Final   Listeria monocytogenes NOT DETECTED NOT DETECTED Final   Staphylococcus species DETECTED (A) NOT DETECTED Final    Comment: CRITICAL RESULT CALLED TO, READ BACK BY AND VERIFIED WITH: J LEDFORD PHARMD 07/04/20 2316 JDW    Staphylococcus aureus (BCID) DETECTED (A) NOT DETECTED Final    Comment: Methicillin (oxacillin) susceptible Staphylococcus aureus (MSSA). Preferred therapy is anti staphylococcal beta lactam antibiotic (Cefazolin or Nafcillin), unless clinically contraindicated. CRITICAL RESULT CALLED TO, READ BACK BY AND VERIFIED WITH: J LEDFORD [facility] 07/04/20 2316 JDW    Staphylococcus epidermidis DETECTED (A) NOT DETECTED Final    Comment: CRITICAL RESULT CALLED TO, READ BACK BY AND VERIFIED WITH: J LEDFORD PHARMD 07/04/20 2316 JDW    Staphylococcus lugdunensis NOT DETECTED NOT DETECTED Final   Streptococcus species DETECTED (A) NOT DETECTED Final    Comment: Not Enterococcus species, Streptococcus agalactiae, Streptococcus pyogenes, or Streptococcus pneumoniae. CRITICAL RESULT CALLED TO, READ BACK BY AND VERIFIED WITH: J LEDFORD PHARMD 07/04/20 2316 JDW    Streptococcus agalactiae NOT DETECTED NOT DETECTED Final   Streptococcus pneumoniae NOT DETECTED NOT DETECTED Final   Streptococcus pyogenes NOT DETECTED NOT DETECTED Final    A.calcoaceticus-baumannii NOT DETECTED NOT DETECTED Final   Bacteroides fragilis NOT DETECTED NOT DETECTED Final   Enterobacterales NOT DETECTED NOT DETECTED Final   Enterobacter cloacae complex NOT DETECTED  NOT DETECTED Final   Escherichia coli NOT DETECTED NOT DETECTED Final   Klebsiella aerogenes NOT DETECTED NOT DETECTED Final   Klebsiella oxytoca NOT DETECTED NOT DETECTED Final   Klebsiella pneumoniae NOT DETECTED NOT DETECTED Final   Proteus species NOT DETECTED NOT DETECTED Final   Salmonella species NOT DETECTED NOT DETECTED Final   Serratia marcescens NOT DETECTED NOT DETECTED Final   Haemophilus influenzae NOT DETECTED NOT DETECTED Final   Neisseria meningitidis NOT DETECTED NOT DETECTED Final   Pseudomonas aeruginosa NOT DETECTED NOT DETECTED Final   Stenotrophomonas maltophilia NOT DETECTED NOT DETECTED Final   Candida albicans NOT DETECTED NOT DETECTED Final   Candida auris NOT DETECTED NOT DETECTED Final   Candida glabrata NOT DETECTED NOT DETECTED Final   Candida krusei NOT DETECTED NOT DETECTED Final   Candida parapsilosis NOT DETECTED NOT DETECTED Final   Candida tropicalis NOT DETECTED NOT DETECTED Final   Cryptococcus neoformans/gattii NOT DETECTED NOT DETECTED Final   Methicillin resistance mecA/C NOT DETECTED NOT DETECTED Final   Meth resistant mecA/C and MREJ NOT DETECTED NOT DETECTED Final    Comment: Performed at [facility], [address], [place], [place] [address]  Culture, blood (routine x 2)     Status: None   Collection Time: 07/04/20  7:55 AM   Specimen: BLOOD RIGHT HAND  Result Value Ref Range Status   Specimen Description BLOOD RIGHT HAND  Final   Special Requests   Final    BOTTLES DRAWN AEROBIC AND ANAEROBIC Blood Culture adequate volume   Culture   Final    NO GROWTH 5 DAYS Performed at [facility], [address] [place], [place] [address]    Report Status 07/09/2020 FINAL  Final  Culture, blood (routine x 2)     Status: None    Collection Time: 07/06/20  4:26 AM   Specimen: BLOOD  Result Value Ref Range Status   Specimen Description BLOOD RIGHT ANTECUBITAL  Final   Special Requests AEROBIC BOTTLE ONLY Blood Culture adequate volume  Final   Culture   Final    NO GROWTH 5 DAYS Performed at [facility], [address] [place], [place] [address]    Report Status 07/11/2020 FINAL  Final  Culture, blood (routine x 2)     Status: None   Collection Time: 07/06/20  4:29 AM   Specimen: BLOOD  Result Value Ref Range Status   Specimen Description BLOOD LEFT ANTECUBITAL  Final   Special Requests AEROBIC BOTTLE ONLY Blood Culture adequate volume  Final   Culture   Final    NO GROWTH 5 DAYS Performed at [facility], [address], [place], [place] [address]    Report Status 07/11/2020 FINAL  Final     Time coordinating discharge in minutes: 65  SIGNED:   Calvert Cantor, MD  [facility] 07/12/2020, 8:33 AM

## 2020-07-12 NOTE — TOC Transition Note (Signed)
Transition of Care [facility]) - CM/SW Discharge Note   Patient Details  Name: Tristian Sickinger MRN: [id number] Date of Birth: 10-07-1990  Transition of Care [facility]) CM/SW Contact:  Kermit Balo, RN Phone Number: 07/12/2020, 8:26 AM   Clinical Narrative:    Pt is discharging today. Pt states he has friends he can potentially stay with. He states his car is in [place]. Mother is planning to provide transportation from the hospital.  Medications for home are at the bedside.  CM was able to get him an appointment at the [facility] in [place] for Methadone. His appt in Monday the 28th at 6:45 am. Pt is aware and agreeable. The clinic stated he could also do a walk in tomorrow through Friday at 6 am and see if there is a no show. Pt is aware.  CM to fax d/c summary to the clinic. Pt gave verbal consent to have information faxed.    Final next level of care: Home/Self Care Barriers to Discharge: No Barriers Identified   Patient Goals and CMS Choice     Choice offered to / list presented to : Patient  Discharge Placement                       Discharge Plan and Services   Discharge Planning Services: CM Consult                                 Social Determinants of Health (SDOH) Interventions     Readmission Risk Interventions No flowsheet data found.

## 2020-07-12 NOTE — Discharge Instructions (Signed)

## 2020-07-21 ENCOUNTER — Inpatient Hospital Stay: Payer: BC Managed Care – PPO | Admitting: Infectious Diseases

## 2020-07-21 LAB — DRUG PROFILE, UR, 9 DRUGS (LABCORP)
Amphetamines, Urine: NEGATIVE ng/mL
Barbiturate, Ur: NEGATIVE ng/mL
Benzodiazepine Quant, Ur: NEGATIVE ng/mL
Cannabinoid Quant, Ur: NEGATIVE ng/mL
Cocaine (Metab.): POSITIVE ng/mL — AB
Methadone Screen, Urine: NEGATIVE ng/mL
Opiate Quant, Ur: NEGATIVE ng/mL
Phencyclidine, Ur: NEGATIVE ng/mL
Propoxyphene, Urine: NEGATIVE ng/mL

## 2020-07-25 ENCOUNTER — Other Ambulatory Visit: Payer: Self-pay | Admitting: Infectious Diseases

## 2020-07-25 ENCOUNTER — Telehealth: Payer: Self-pay

## 2020-07-25 DIAGNOSIS — B9561 Methicillin susceptible Staphylococcus aureus infection as the cause of diseases classified elsewhere: Secondary | ICD-10-CM

## 2020-07-25 NOTE — Telephone Encounter (Signed)
[facility] ID doctor requesting Dr. Elinor Parkinson call. Did not disclose reason for call.  [phone] Dr. Oswaldo Milian

## 2020-07-25 NOTE — Telephone Encounter (Signed)
Patient and patient's mother is calling stating patient lives closer to [place] and would like to see an ID provider in [place]. Can a referral be ordered at urgent for the patient to see [facility] Infectious Diseases? They will need hospital discharge summary and labs. Fax number is below and will need to be to attention [facility]  [address], [address]  [place], [place]  [address]   Phone  [phone]  Fax  [phone] Patient mother stated patient would like the same treatment plan that was planned for him here at our office with Dr. Elinor Parkinson. I have advised the patient and patient's mother has been advised that [facility] ID will more than likely have their own treatment plan and our office has no control over what they decide for treatment.

## 2020-07-25 NOTE — Telephone Encounter (Signed)
Sure, I will put in a referral. Thanks.

## 2020-07-25 NOTE — Telephone Encounter (Signed)
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 So, I will cancel the referral. I will tell him to check CBC with his PCP to make sure his blood count is OK

## 2020-07-26 ENCOUNTER — Other Ambulatory Visit: Payer: Self-pay | Admitting: Infectious Diseases

## 2020-07-26 ENCOUNTER — Telehealth: Payer: Self-pay

## 2020-07-26 NOTE — Telephone Encounter (Signed)
-----   Message from Odette Fraction, MD sent at 07/26/2020  8:47 AM EST ----- Regarding: FU Could you please tell him that he does not need a referral to another ID specialist at [facility] as he has completed treatment for MSSA bacteremia? He can follow up with his PCP to get a CBC to make sure his blood counts are OK.

## 2020-07-26 NOTE — Telephone Encounter (Signed)
RN attempted to call patient to relay Dr. Leafy Half message. Patient's mother answered, RN requested that she have her son call our office back when he gets a chance.   Sandie Ano, RN

## 2020-07-26 NOTE — Telephone Encounter (Signed)
Patient returned call. RN relayed Dr Leafy Half message to him. He had no further questions, verbalized understanding.  No additional needs at this time. Andree Coss, RN

## 2020-07-28 ENCOUNTER — Inpatient Hospital Stay: Payer: BC Managed Care – PPO | Admitting: Infectious Diseases

## 2021-08-02 IMAGING — CT CT HEAD W/O CM
4 series · 17 of 47 positions shown, 19 images · non-contrast
Comparison: None

CLINICAL DATA: Mental status changes of unknown cause

EXAM:
CT HEAD WITHOUT CONTRAST
TECHNIQUE: Contiguous axial images were obtained from the base of the skull
through the vertex without intravenous contrast. Sagittal and
coronal MPR images reconstructed from axial data set.

[Series 3: head bone · axial · 0.46mm/px · z∈[-148,-86]mm · 4 of 88 slices shown]
[im 9/88  bone]
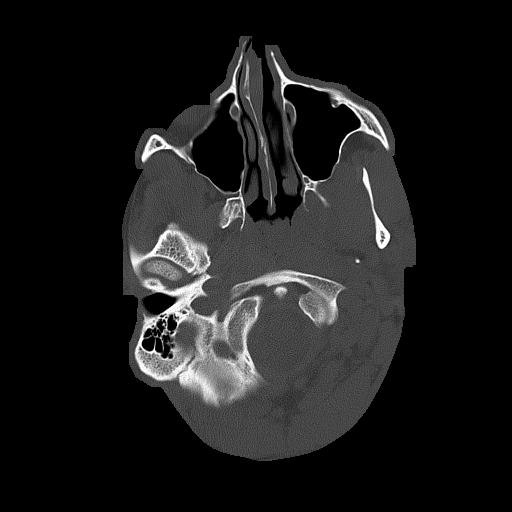
[im 18/88  bone]
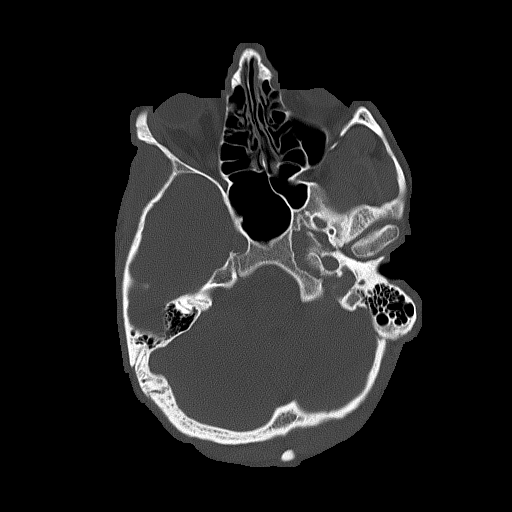
[im 27/88  bone]
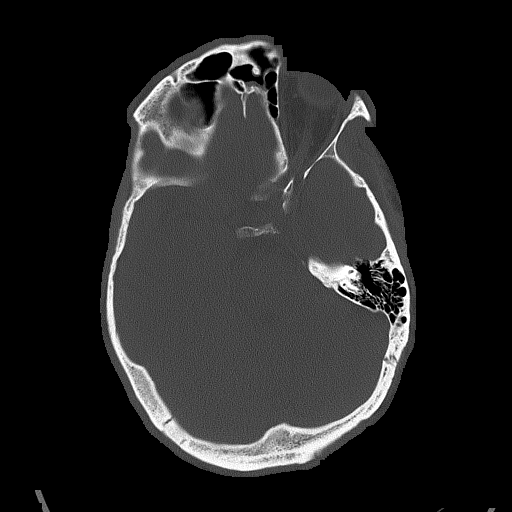
[im 40/88  bone]
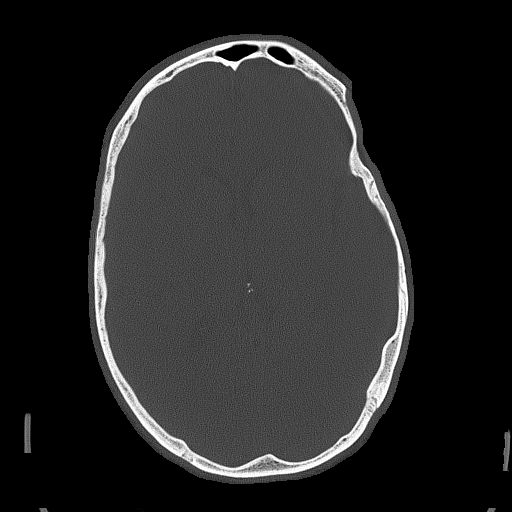

[Series 4: head without · axial · non-contrast · 0.46mm/px · z∈[-144,-19]mm · 7 of 35 slices shown, 9 images]
[im 5/35  brain]
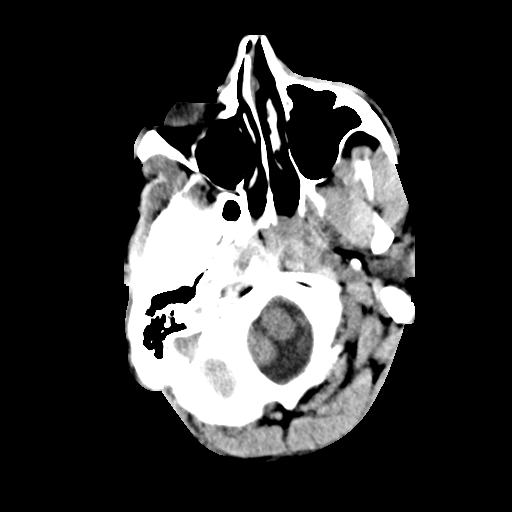
[im 5/35  bone]
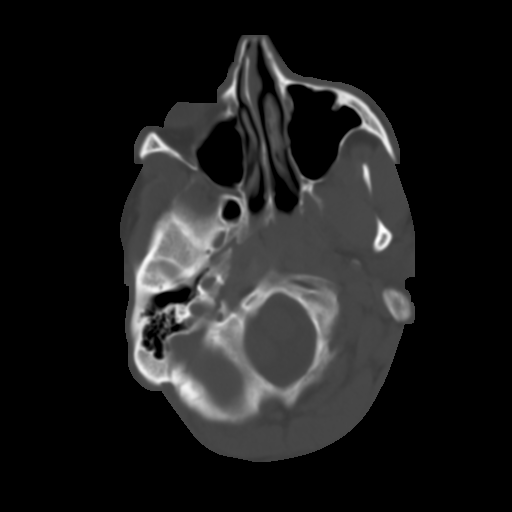
[im 9/35  brain]
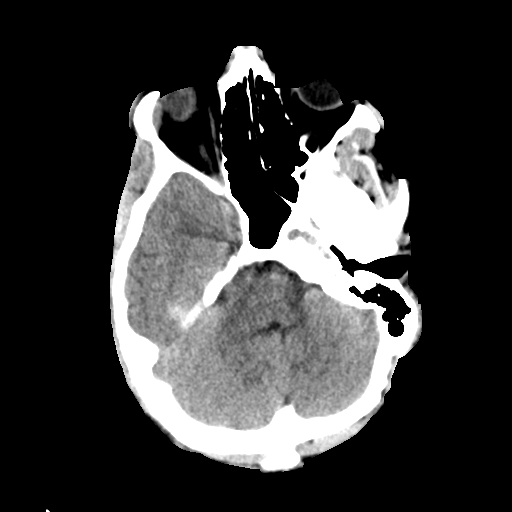
[im 13/35  brain]
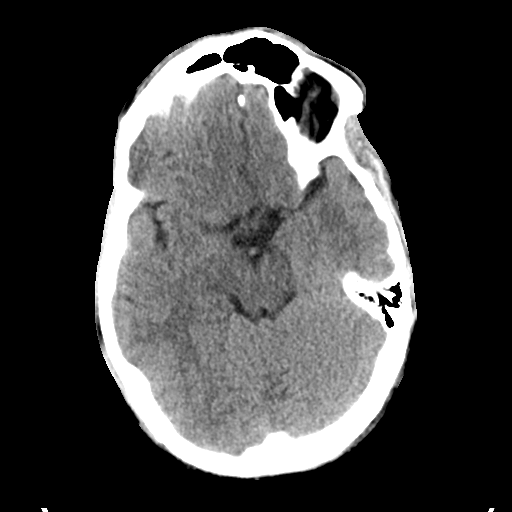
[im 18/35  brain]
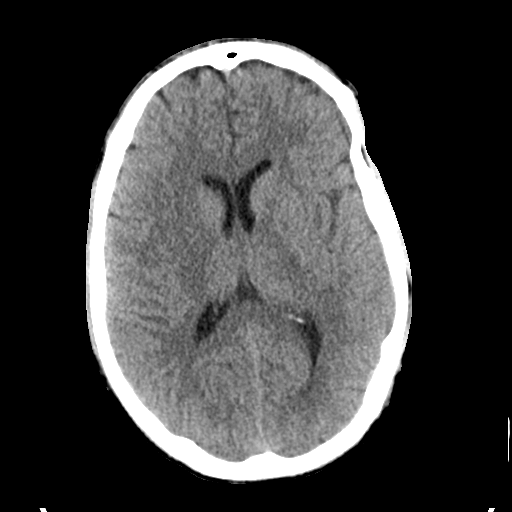
[im 22/35  brain]
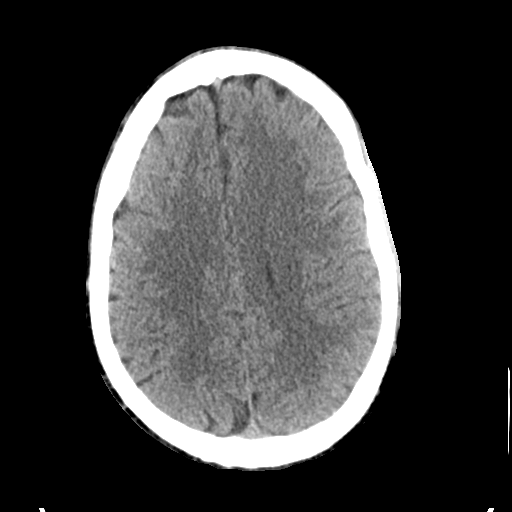
[im 22/35  bone]
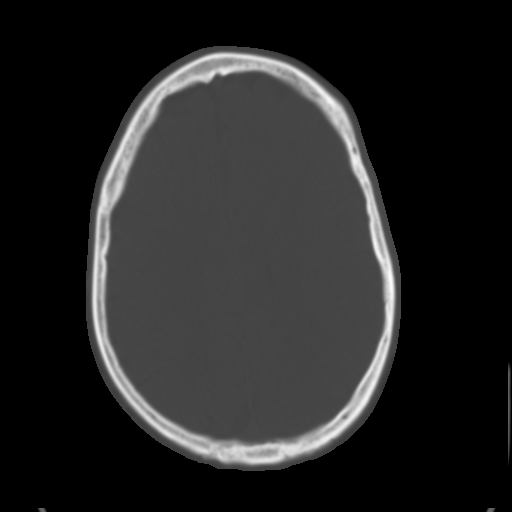
[im 26/35  brain]
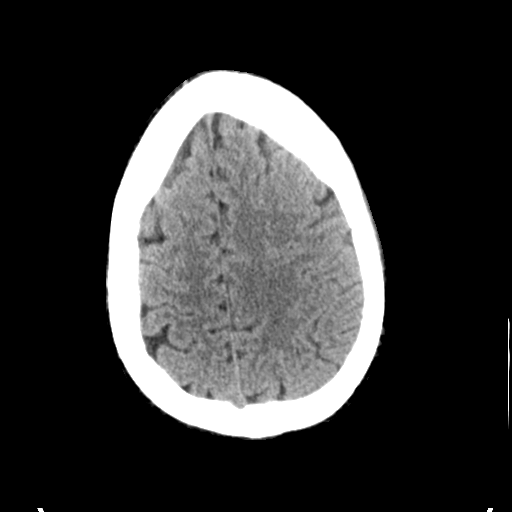
[im 30/35  brain]
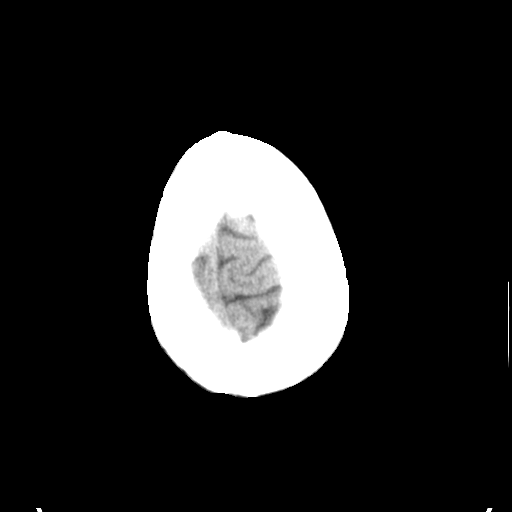

[Series 5: head without cor · coronal · non-contrast · 0.34mm/px · 3 of 76 slices shown]
[im 26/76  brain]
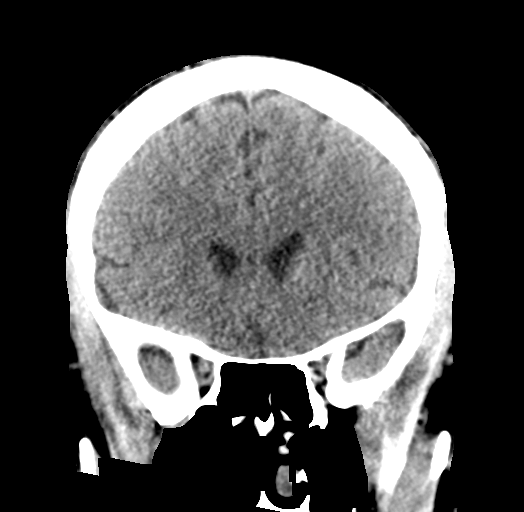
[im 34/76  brain]
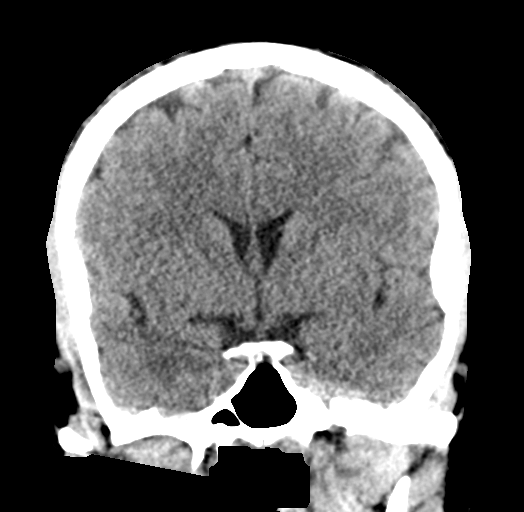
[im 42/76  brain]
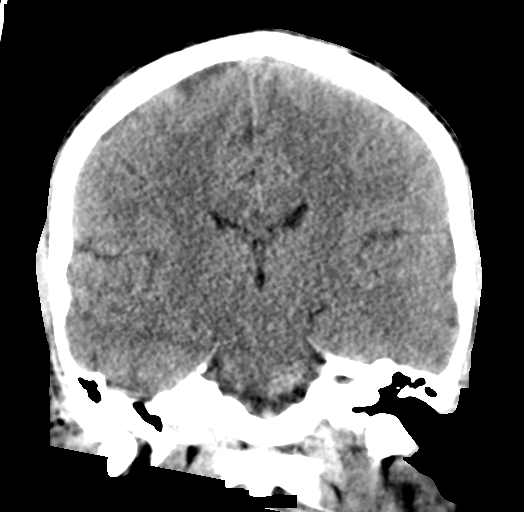

[Series 6: head without sag · sagittal · non-contrast · 0.34mm/px · 3 of 57 slices shown]
[im 24/57  brain]
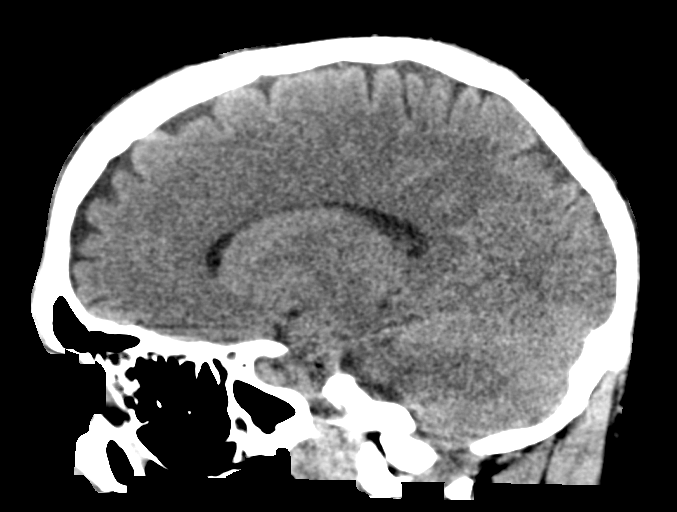
[im 29/57  brain]
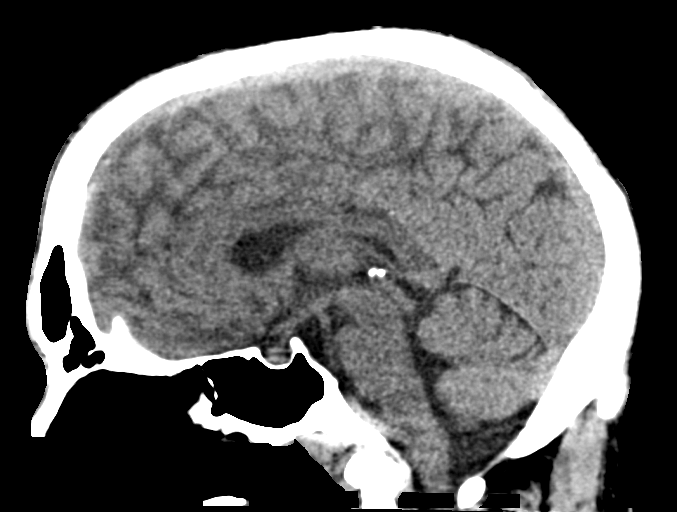
[im 33/57  brain]
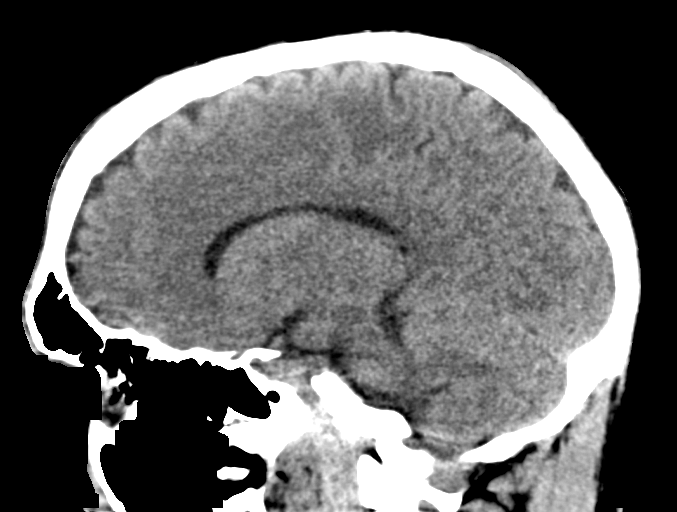

[17 of 47 positions shown; findings below may reference images not displayed]

FINDINGS: Brain: Normal ventricular morphology. No midline shift or mass
effect. Normal appearance of brain parenchyma. No intracranial
hemorrhage, mass lesion, or evidence of acute infarction. No
extra-axial fluid collections.

Vascular: No hyperdense vessels

Skull: Intact

Sinuses/Orbits: Clear

Other: N/A
IMPRESSION: Normal exam.

## 2022-04-10 ENCOUNTER — Other Ambulatory Visit (HOSPITAL_COMMUNITY): Payer: Self-pay

## 2022-11-05 ENCOUNTER — Other Ambulatory Visit (HOSPITAL_COMMUNITY): Payer: Self-pay
